# Patient Record
Sex: Female | Born: 1940 | Race: Black or African American | Hispanic: No | Smoking: Never smoker
Health system: Southern US, Community
[De-identification: ages and names within clinical notes are randomized; demographics above are authoritative.]

## PROBLEM LIST (undated history)

## (undated) DIAGNOSIS — K219 Gastro-esophageal reflux disease without esophagitis: Secondary | ICD-10-CM

## (undated) DIAGNOSIS — G4730 Sleep apnea, unspecified: Secondary | ICD-10-CM

## (undated) DIAGNOSIS — M109 Gout, unspecified: Secondary | ICD-10-CM

## (undated) DIAGNOSIS — M1990 Unspecified osteoarthritis, unspecified site: Secondary | ICD-10-CM

## (undated) DIAGNOSIS — I10 Essential (primary) hypertension: Secondary | ICD-10-CM

## (undated) HISTORY — PX: CYST REMOVAL HAND: SHX6279

## (undated) HISTORY — PX: DILATION AND CURETTAGE OF UTERUS: SHX78

## (undated) HISTORY — PX: CORONARY ANGIOPLASTY WITH STENT PLACEMENT: SHX49

## (undated) HISTORY — PX: VEIN LIGATION AND STRIPPING: SHX2653

---

## 2014-11-23 ENCOUNTER — Other Ambulatory Visit: Payer: Self-pay | Admitting: Orthopedic Surgery

## 2014-11-23 DIAGNOSIS — M1711 Unilateral primary osteoarthritis, right knee: Secondary | ICD-10-CM

## 2014-11-29 ENCOUNTER — Ambulatory Visit
Admission: RE | Admit: 2014-11-29 | Discharge: 2014-11-29 | Disposition: A | Discharge status: Home / Self Care | Payer: Medicare HMO | Source: Ambulatory Visit | Attending: Orthopedic Surgery | Admitting: Orthopedic Surgery

## 2014-11-29 DIAGNOSIS — M1711 Unilateral primary osteoarthritis, right knee: Secondary | ICD-10-CM | POA: Diagnosis not present

## 2014-12-28 ENCOUNTER — Encounter
Admission: RE | Admit: 2014-12-28 | Discharge: 2014-12-28 | Disposition: A | Discharge status: Home / Self Care | Payer: Medicare HMO | Source: Ambulatory Visit | Attending: Orthopedic Surgery | Admitting: Orthopedic Surgery

## 2014-12-28 DIAGNOSIS — Z01812 Encounter for preprocedural laboratory examination: Secondary | ICD-10-CM | POA: Insufficient documentation

## 2014-12-28 DIAGNOSIS — R001 Bradycardia, unspecified: Secondary | ICD-10-CM | POA: Diagnosis not present

## 2014-12-28 DIAGNOSIS — I10 Essential (primary) hypertension: Secondary | ICD-10-CM | POA: Diagnosis not present

## 2014-12-28 DIAGNOSIS — Z0183 Encounter for blood typing: Secondary | ICD-10-CM | POA: Insufficient documentation

## 2014-12-28 HISTORY — DX: Gastro-esophageal reflux disease without esophagitis: K21.9

## 2014-12-28 HISTORY — DX: Gout, unspecified: M10.9

## 2014-12-28 HISTORY — DX: Unspecified osteoarthritis, unspecified site: M19.90

## 2014-12-28 HISTORY — DX: Essential (primary) hypertension: I10

## 2014-12-28 LAB — BASIC METABOLIC PANEL
Anion gap: 9 (ref 5–15)
BUN: 23 mg/dL — AB (ref 6–20)
CALCIUM: 10 mg/dL (ref 8.9–10.3)
CO2: 24 mmol/L (ref 22–32)
CREATININE: 1.23 mg/dL — AB (ref 0.44–1.00)
Chloride: 107 mmol/L (ref 101–111)
GFR calc Af Amer: 50 mL/min — ABNORMAL LOW (ref >60)
GFR, EST NON AFRICAN AMERICAN: 43 mL/min — AB (ref >60)
Glucose, Bld: 102 mg/dL — ABNORMAL HIGH (ref 65–99)
Potassium: 4.4 mmol/L (ref 3.5–5.1)
SODIUM: 140 mmol/L (ref 135–145)

## 2014-12-28 LAB — CBC
HEMATOCRIT: 39.7 % (ref 35.0–47.0)
HEMOGLOBIN: 12.7 g/dL (ref 12.0–16.0)
MCH: 28.8 pg (ref 26.0–34.0)
MCHC: 31.9 g/dL — AB (ref 32.0–36.0)
MCV: 90.3 fL (ref 80.0–100.0)
Platelets: 186 10*3/uL (ref 150–440)
RBC: 4.39 MIL/uL (ref 3.80–5.20)
RDW: 16.5 % — ABNORMAL HIGH (ref 11.5–14.5)
WBC: 5.6 10*3/uL (ref 3.6–11.0)

## 2014-12-28 LAB — APTT: APTT: 30 s (ref 24–36)

## 2014-12-28 LAB — URINALYSIS COMPLETE WITH MICROSCOPIC (ARMC ONLY)
Bilirubin Urine: NEGATIVE
Glucose, UA: NEGATIVE mg/dL
Hgb urine dipstick: NEGATIVE
Ketones, ur: NEGATIVE mg/dL
Nitrite: NEGATIVE
PROTEIN: 30 mg/dL — AB
Specific Gravity, Urine: 1.015 (ref 1.005–1.030)
pH: 5 (ref 5.0–8.0)

## 2014-12-28 LAB — SURGICAL PCR SCREEN
MRSA, PCR: NEGATIVE
Staphylococcus aureus: NEGATIVE

## 2014-12-28 LAB — SEDIMENTATION RATE: Sed Rate: 28 mm/hr (ref 0–30)

## 2014-12-28 LAB — TYPE AND SCREEN
ABO/RH(D): O POS
Antibody Screen: NEGATIVE

## 2014-12-28 LAB — ABO/RH: ABO/RH(D): O POS

## 2014-12-28 LAB — PROTIME-INR
INR: 0.96
Prothrombin Time: 13 seconds (ref 11.4–15.0)

## 2014-12-28 NOTE — Patient Instructions (Signed)
  Your procedure is scheduled on: January 12, 2015 (Thursday) Report to Day Surgery. To find out your arrival time please call [phone] between 1PM - 3PM on January 11, 2015 (Wednesday).  Remember: Instructions that are not followed completely may result in serious medical risk, up to and including death, or upon the discretion of your surgeon and anesthesiologist your surgery may need to be rescheduled.    __x__ 1. Do not eat food or drink liquids after midnight. No gum chewing or hard candies.     ____ 2. No Alcohol for 24 hours before or after surgery.   ____ 3. Bring all medications with you on the day of surgery if instructed.    __x__ 4. Notify your doctor if there is any change in your medical condition     (cold, fever, infections).     Do not wear jewelry, make-up, hairpins, clips or nail polish.  Do not wear lotions, powders, or perfumes. You may wear deodorant.  Do not shave 48 hours prior to surgery. Men may shave face and neck.  Do not bring valuables to the hospital.    [facility] is not responsible for any belongings or valuables.               Contacts, dentures or bridgework may not be worn into surgery.  Leave your suitcase in the car. After surgery it may be brought to your room.  For patients admitted to the hospital, discharge time is determined by your                treatment team.   Patients discharged the day of surgery will not be allowed to drive home.   Please read over the following fact sheets that you were given:   MRSA Information and Surgical Site Infection Prevention   ____ Take these medicines the morning of surgery with A SIP OF WATER:    1. Nexium (Nexium at bedtime on August 10)  2.   3.   4.  5.  6.  ____ Fleet Enema (as directed)   _x___ Use CHG Soap as directed  ____ Use inhalers on the day of surgery  ____ Stop metformin 2 days prior to surgery    ____ Take 1/2 of usual insulin dose the night before surgery and none on the  morning of surgery.   ____ Stop Coumadin/Plavix/aspirin on (Ask Dr. Rudene Christians about stopping aspirin and Plavix)  ____ Stop Anti-inflammatories on (Tylenol ok to take for pain)   ____ Stop supplements until after surgery.    ____ Bring C-Pap to the hospital.

## 2014-12-28 NOTE — OR Nursing (Signed)
CLEARANCE BY ANESTHESIA REQUESTED AND FAXED TO [place] AND DR [facility] OFFICE NOTIFIED

## 2014-12-31 LAB — URINE CULTURE

## 2015-01-11 NOTE — OR Nursing (Signed)
CLEARED BY DR Murray

## 2015-01-11 NOTE — OR Nursing (Signed)
REFERRED BY PCP TO [place] [facility]

## 2015-01-12 ENCOUNTER — Encounter
Admission: RE | Disposition: A | Discharge status: Skilled Nursing Facility | Payer: Self-pay | Source: Ambulatory Visit | Attending: Orthopedic Surgery

## 2015-01-12 ENCOUNTER — Inpatient Hospital Stay: Payer: Medicare HMO

## 2015-01-12 ENCOUNTER — Inpatient Hospital Stay: Payer: Medicare HMO | Admitting: Anesthesiology

## 2015-01-12 ENCOUNTER — Encounter: Payer: Self-pay | Admitting: *Deleted

## 2015-01-12 ENCOUNTER — Inpatient Hospital Stay
Admission: RE | Admit: 2015-01-12 | Discharge: 2015-01-15 | DRG: 470 | Disposition: A | Discharge status: Skilled Nursing Facility | Payer: Medicare HMO | Source: Ambulatory Visit | Attending: Orthopedic Surgery | Admitting: Orthopedic Surgery

## 2015-01-12 DIAGNOSIS — Z7982 Long term (current) use of aspirin: Secondary | ICD-10-CM

## 2015-01-12 DIAGNOSIS — Z86718 Personal history of other venous thrombosis and embolism: Secondary | ICD-10-CM | POA: Diagnosis not present

## 2015-01-12 DIAGNOSIS — G4700 Insomnia, unspecified: Secondary | ICD-10-CM | POA: Diagnosis present

## 2015-01-12 DIAGNOSIS — M1710 Unilateral primary osteoarthritis, unspecified knee: Secondary | ICD-10-CM | POA: Diagnosis present

## 2015-01-12 DIAGNOSIS — Z6841 Body Mass Index (BMI) 40.0 and over, adult: Secondary | ICD-10-CM

## 2015-01-12 DIAGNOSIS — E785 Hyperlipidemia, unspecified: Secondary | ICD-10-CM | POA: Diagnosis present

## 2015-01-12 DIAGNOSIS — I10 Essential (primary) hypertension: Secondary | ICD-10-CM | POA: Diagnosis present

## 2015-01-12 DIAGNOSIS — Z888 Allergy status to other drugs, medicaments and biological substances status: Secondary | ICD-10-CM

## 2015-01-12 DIAGNOSIS — Z79899 Other long term (current) drug therapy: Secondary | ICD-10-CM | POA: Diagnosis not present

## 2015-01-12 DIAGNOSIS — M1711 Unilateral primary osteoarthritis, right knee: Principal | ICD-10-CM | POA: Diagnosis present

## 2015-01-12 DIAGNOSIS — K219 Gastro-esophageal reflux disease without esophagitis: Secondary | ICD-10-CM | POA: Diagnosis present

## 2015-01-12 DIAGNOSIS — E669 Obesity, unspecified: Secondary | ICD-10-CM | POA: Diagnosis present

## 2015-01-12 DIAGNOSIS — M109 Gout, unspecified: Secondary | ICD-10-CM | POA: Diagnosis present

## 2015-01-12 DIAGNOSIS — Z951 Presence of aortocoronary bypass graft: Secondary | ICD-10-CM | POA: Diagnosis not present

## 2015-01-12 DIAGNOSIS — I25810 Atherosclerosis of coronary artery bypass graft(s) without angina pectoris: Secondary | ICD-10-CM | POA: Diagnosis present

## 2015-01-12 DIAGNOSIS — G8918 Other acute postprocedural pain: Secondary | ICD-10-CM

## 2015-01-12 HISTORY — PX: TOTAL KNEE ARTHROPLASTY: SHX125

## 2015-01-12 LAB — TYPE AND SCREEN
ABO/RH(D): O POS
Antibody Screen: NEGATIVE

## 2015-01-12 SURGERY — ARTHROPLASTY, KNEE, TOTAL
Anesthesia: Spinal | Site: Knee | Laterality: Right | Wound class: Clean

## 2015-01-12 MED ORDER — MAGNESIUM CITRATE PO SOLN
1.0000 | Freq: Once | ORAL | Status: DC | PRN
  Filled 2015-01-12: qty 296

## 2015-01-12 MED ORDER — TRANEXAMIC ACID 1000 MG/10ML IV SOLN
1000.0000 mg | INTRAVENOUS | Status: DC | PRN
  Administered 2015-01-12: 1000 mg via INTRAVENOUS

## 2015-01-12 MED ORDER — BUPIVACAINE-EPINEPHRINE (PF) 0.25% -1:200000 IJ SOLN
INTRAMUSCULAR | Status: DC | PRN
  Administered 2015-01-12: 30 mL via PERINEURAL

## 2015-01-12 MED ORDER — QUINAPRIL HCL 10 MG PO TABS
40.0000 mg | ORAL_TABLET | Freq: Every day | ORAL | Status: DC
  Administered 2015-01-13 – 2015-01-15 (×3): 40 mg via ORAL
  Filled 2015-01-12 (×4): qty 4

## 2015-01-12 MED ORDER — SODIUM CHLORIDE 0.9 % IV SOLN
INTRAVENOUS | Status: DC
  Administered 2015-01-12 – 2015-01-13 (×2): via INTRAVENOUS

## 2015-01-12 MED ORDER — METOPROLOL TARTRATE 50 MG PO TABS
50.0000 mg | ORAL_TABLET | Freq: Two times a day (BID) | ORAL | Status: DC
  Administered 2015-01-12 – 2015-01-15 (×6): 50 mg via ORAL
  Filled 2015-01-12 (×7): qty 1

## 2015-01-12 MED ORDER — GLYCOPYRROLATE 0.2 MG/ML IJ SOLN
INTRAMUSCULAR | Status: DC | PRN
  Administered 2015-01-12: 0.2 mg via INTRAVENOUS

## 2015-01-12 MED ORDER — MORPHINE SULFATE 2 MG/ML IJ SOLN
2.0000 mg | INTRAMUSCULAR | Status: DC | PRN
  Administered 2015-01-12 – 2015-01-13 (×6): 2 mg via INTRAVENOUS
  Filled 2015-01-12 (×5): qty 1

## 2015-01-12 MED ORDER — METOCLOPRAMIDE HCL 5 MG/ML IJ SOLN: 5.0000 mg | Freq: Three times a day (TID) | INTRAMUSCULAR | Status: DC | PRN

## 2015-01-12 MED ORDER — SODIUM CHLORIDE 0.9 % IJ SOLN
INTRAMUSCULAR | Status: AC
  Filled 2015-01-12: qty 50

## 2015-01-12 MED ORDER — CEFAZOLIN SODIUM-DEXTROSE 2-3 GM-% IV SOLR
2.0000 g | Freq: Four times a day (QID) | INTRAVENOUS | Status: AC
  Administered 2015-01-12 (×2): 2 g via INTRAVENOUS
  Filled 2015-01-12 (×2): qty 50

## 2015-01-12 MED ORDER — LACTATED RINGERS IV SOLN
INTRAVENOUS | Status: DC
  Administered 2015-01-12 (×2): via INTRAVENOUS

## 2015-01-12 MED ORDER — ASPIRIN EC 325 MG PO TBEC
325.0000 mg | DELAYED_RELEASE_TABLET | Freq: Every day | ORAL | Status: DC
  Administered 2015-01-13 – 2015-01-15 (×3): 325 mg via ORAL
  Filled 2015-01-12 (×3): qty 1

## 2015-01-12 MED ORDER — LIDOCAINE HCL (PF) 2 % IJ SOLN
INTRAMUSCULAR | Status: DC | PRN
  Administered 2015-01-12: 50 mg

## 2015-01-12 MED ORDER — ZOLPIDEM TARTRATE 5 MG PO TABS: 5.0000 mg | ORAL_TABLET | Freq: Every evening | ORAL | Status: DC | PRN

## 2015-01-12 MED ORDER — BUPIVACAINE LIPOSOME 1.3 % IJ SUSP
INTRAMUSCULAR | Status: AC
  Filled 2015-01-12: qty 20

## 2015-01-12 MED ORDER — MORPHINE (PF) INJECTION FOR INHALATION 10 MG/ML
RESPIRATORY_TRACT | Status: DC | PRN
  Administered 2015-01-12: 10 mg via RESPIRATORY_TRACT

## 2015-01-12 MED ORDER — ASPIRIN EC 81 MG PO TBEC: 81.0000 mg | DELAYED_RELEASE_TABLET | Freq: Every day | ORAL | Status: DC

## 2015-01-12 MED ORDER — ALLOPURINOL 300 MG PO TABS
300.0000 mg | ORAL_TABLET | Freq: Every day | ORAL | Status: DC
  Administered 2015-01-12 – 2015-01-15 (×4): 300 mg via ORAL
  Filled 2015-01-12 (×5): qty 1

## 2015-01-12 MED ORDER — ACETAMINOPHEN 650 MG RE SUPP: 650.0000 mg | Freq: Four times a day (QID) | RECTAL | Status: DC | PRN

## 2015-01-12 MED ORDER — ISOSORBIDE DINITRATE 10 MG PO TABS
20.0000 mg | ORAL_TABLET | Freq: Every evening | ORAL | Status: DC
  Administered 2015-01-12 – 2015-01-14 (×3): 20 mg via ORAL
  Filled 2015-01-12 (×3): qty 2

## 2015-01-12 MED ORDER — ACETAMINOPHEN 325 MG PO TABS: 650.0000 mg | ORAL_TABLET | Freq: Four times a day (QID) | ORAL | Status: DC | PRN

## 2015-01-12 MED ORDER — BISACODYL 10 MG RE SUPP
10.0000 mg | Freq: Every day | RECTAL | Status: DC | PRN
Start: 2015-01-12 — End: 2015-01-15
  Administered 2015-01-14: 10 mg via RECTAL
  Filled 2015-01-12: qty 1

## 2015-01-12 MED ORDER — FENTANYL CITRATE (PF) 100 MCG/2ML IJ SOLN
INTRAMUSCULAR | Status: DC | PRN
  Administered 2015-01-12: 25 ug via INTRAVENOUS
  Administered 2015-01-12: 50 ug via INTRAVENOUS
  Administered 2015-01-12: 25 ug via INTRAVENOUS

## 2015-01-12 MED ORDER — PANTOPRAZOLE SODIUM 40 MG PO TBEC
40.0000 mg | DELAYED_RELEASE_TABLET | Freq: Every day | ORAL | Status: DC
  Administered 2015-01-12 – 2015-01-15 (×4): 40 mg via ORAL
  Filled 2015-01-12 (×5): qty 1

## 2015-01-12 MED ORDER — NEOMYCIN-POLYMYXIN B GU 40-200000 IR SOLN
Status: DC | PRN
  Administered 2015-01-12: 16 mL

## 2015-01-12 MED ORDER — TRAZODONE HCL 100 MG PO TABS
100.0000 mg | ORAL_TABLET | Freq: Every day | ORAL | Status: DC
  Administered 2015-01-12 – 2015-01-14 (×3): 100 mg via ORAL
  Filled 2015-01-12 (×3): qty 1

## 2015-01-12 MED ORDER — MENTHOL 3 MG MT LOZG
1.0000 | LOZENGE | OROMUCOSAL | Status: DC | PRN
  Filled 2015-01-12: qty 9

## 2015-01-12 MED ORDER — CEFAZOLIN SODIUM-DEXTROSE 2-3 GM-% IV SOLR
2.0000 g | Freq: Once | INTRAVENOUS | Status: AC
  Administered 2015-01-12: 2 g via INTRAVENOUS

## 2015-01-12 MED ORDER — ONDANSETRON HCL 4 MG/2ML IJ SOLN: 4.0000 mg | Freq: Four times a day (QID) | INTRAMUSCULAR | Status: DC | PRN

## 2015-01-12 MED ORDER — FUROSEMIDE 20 MG PO TABS
20.0000 mg | ORAL_TABLET | Freq: Every day | ORAL | Status: DC
  Administered 2015-01-13 – 2015-01-15 (×3): 20 mg via ORAL
  Filled 2015-01-12 (×5): qty 1

## 2015-01-12 MED ORDER — MAGNESIUM HYDROXIDE 400 MG/5ML PO SUSP
30.0000 mL | Freq: Every day | ORAL | Status: DC | PRN
  Administered 2015-01-14: 30 mL via ORAL
  Filled 2015-01-12: qty 30

## 2015-01-12 MED ORDER — NITROGLYCERIN 0.4 MG SL SUBL: 0.4000 mg | SUBLINGUAL_TABLET | SUBLINGUAL | Status: DC | PRN

## 2015-01-12 MED ORDER — ONDANSETRON HCL 4 MG PO TABS: 4.0000 mg | ORAL_TABLET | Freq: Four times a day (QID) | ORAL | Status: DC | PRN

## 2015-01-12 MED ORDER — BUPIVACAINE HCL (PF) 0.5 % IJ SOLN
INTRAMUSCULAR | Status: AC
  Filled 2015-01-12: qty 30

## 2015-01-12 MED ORDER — CEFAZOLIN SODIUM-DEXTROSE 2-3 GM-% IV SOLR
INTRAVENOUS | Status: AC
  Filled 2015-01-12: qty 50

## 2015-01-12 MED ORDER — ONDANSETRON HCL 4 MG/2ML IJ SOLN: 4.0000 mg | Freq: Once | INTRAMUSCULAR | Status: DC | PRN

## 2015-01-12 MED ORDER — ALUM & MAG HYDROXIDE-SIMETH 200-200-20 MG/5ML PO SUSP
30.0000 mL | ORAL | Status: DC | PRN
  Administered 2015-01-12: 30 mL via ORAL
  Filled 2015-01-12: qty 30

## 2015-01-12 MED ORDER — PROPOFOL INFUSION 10 MG/ML OPTIME
INTRAVENOUS | Status: DC | PRN
  Administered 2015-01-12: 25 ug/kg/min via INTRAVENOUS

## 2015-01-12 MED ORDER — BUPIVACAINE LIPOSOME 1.3 % IJ SUSP
INTRAMUSCULAR | Status: DC | PRN
Start: 2015-01-12 — End: 2015-01-12
  Administered 2015-01-12: 60 mL

## 2015-01-12 MED ORDER — SODIUM CHLORIDE 0.9 % IJ SOLN: INTRAMUSCULAR | Status: DC | PRN

## 2015-01-12 MED ORDER — WHITE PETROLATUM GEL
Status: AC
  Filled 2015-01-12: qty 5

## 2015-01-12 MED ORDER — BUPIVACAINE-EPINEPHRINE (PF) 0.25% -1:200000 IJ SOLN
INTRAMUSCULAR | Status: AC
  Filled 2015-01-12: qty 30

## 2015-01-12 MED ORDER — FENTANYL CITRATE (PF) 100 MCG/2ML IJ SOLN: 25.0000 ug | INTRAMUSCULAR | Status: DC | PRN

## 2015-01-12 MED ORDER — CLOPIDOGREL BISULFATE 75 MG PO TABS
75.0000 mg | ORAL_TABLET | Freq: Every day | ORAL | Status: DC
  Administered 2015-01-12 – 2015-01-15 (×4): 75 mg via ORAL
  Filled 2015-01-12 (×5): qty 1

## 2015-01-12 MED ORDER — DOCUSATE SODIUM 100 MG PO CAPS
100.0000 mg | ORAL_CAPSULE | Freq: Two times a day (BID) | ORAL | Status: DC
  Administered 2015-01-12 – 2015-01-15 (×7): 100 mg via ORAL
  Filled 2015-01-12 (×8): qty 1

## 2015-01-12 MED ORDER — NEOMYCIN-POLYMYXIN B GU 40-200000 IR SOLN
Status: AC
  Filled 2015-01-12: qty 20

## 2015-01-12 MED ORDER — ATORVASTATIN CALCIUM 20 MG PO TABS
40.0000 mg | ORAL_TABLET | Freq: Every day | ORAL | Status: DC
  Administered 2015-01-12 – 2015-01-14 (×3): 40 mg via ORAL
  Filled 2015-01-12 (×3): qty 2

## 2015-01-12 MED ORDER — FELODIPINE ER 5 MG PO TB24
10.0000 mg | ORAL_TABLET | Freq: Every day | ORAL | Status: DC
  Administered 2015-01-13 – 2015-01-15 (×3): 10 mg via ORAL
  Filled 2015-01-12: qty 1
  Filled 2015-01-12 (×4): qty 2

## 2015-01-12 MED ORDER — PHENOL 1.4 % MT LIQD
1.0000 | OROMUCOSAL | Status: DC | PRN
Start: 2015-01-12 — End: 2015-01-15
  Filled 2015-01-12: qty 177

## 2015-01-12 MED ORDER — METOCLOPRAMIDE HCL 5 MG PO TABS: 5.0000 mg | ORAL_TABLET | Freq: Three times a day (TID) | ORAL | Status: DC | PRN

## 2015-01-12 MED ORDER — MORPHINE SULFATE 10 MG/ML IJ SOLN
INTRAMUSCULAR | Status: AC
  Filled 2015-01-12: qty 1

## 2015-01-12 MED ORDER — BUPIVACAINE HCL (PF) 0.5 % IJ SOLN
INTRAMUSCULAR | Status: DC | PRN
  Administered 2015-01-12: 3 mL via INTRATHECAL

## 2015-01-12 MED ORDER — OXYCODONE HCL 5 MG PO TABS
5.0000 mg | ORAL_TABLET | ORAL | Status: DC | PRN
  Administered 2015-01-12 (×2): 10 mg via ORAL
  Administered 2015-01-12: 5 mg via ORAL
  Administered 2015-01-13 – 2015-01-14 (×6): 10 mg via ORAL
  Administered 2015-01-15: 5 mg via ORAL
  Administered 2015-01-15: 10 mg via ORAL
  Filled 2015-01-12 (×4): qty 2
  Filled 2015-01-12: qty 1
  Filled 2015-01-12 (×3): qty 2
  Filled 2015-01-12: qty 1
  Filled 2015-01-12 (×2): qty 2

## 2015-01-12 MED ORDER — MIDAZOLAM HCL 5 MG/5ML IJ SOLN
INTRAMUSCULAR | Status: DC | PRN
  Administered 2015-01-12 (×2): 1 mg via INTRAVENOUS

## 2015-01-12 SURGICAL SUPPLY — 53 items
BANDAGE ELASTIC 6 CLIP ST LF (GAUZE/BANDAGES/DRESSINGS) ×2 IMPLANT
BLADE BOVIE TIP EXT 4 (BLADE) ×2 IMPLANT
BLADE SAW 1 (BLADE) ×2 IMPLANT
BLOCK CUTTING TIBIAL 2 RT (MISCELLANEOUS) IMPLANT
BLOCK FEMUR CUTTING 2P RIGHT (MISCELLANEOUS) IMPLANT
CANISTER SUCT 1200ML W/VALVE (MISCELLANEOUS) ×2 IMPLANT
CANISTER SUCT 3000ML (MISCELLANEOUS) ×4 IMPLANT
CAPT KNEE TOTAL 3 ×2 IMPLANT
CATH FOL LEG HOLDER (MISCELLANEOUS) ×2 IMPLANT
CATH TRAY METER 16FR LF (MISCELLANEOUS) ×2 IMPLANT
CEMENT HV SMART SET (Cement) ×4 IMPLANT
CHLORAPREP W/TINT 26ML (MISCELLANEOUS) ×4 IMPLANT
COOLER POLAR GLACIER W/PUMP (MISCELLANEOUS) ×2 IMPLANT
DRAPE INCISE IOBAN 66X45 STRL (DRAPES) ×2 IMPLANT
DRAPE SHEET LG 3/4 BI-LAMINATE (DRAPES) ×4 IMPLANT
ELECT CAUTERY BLADE 6.4 (BLADE) ×2 IMPLANT
GAUZE PETRO XEROFOAM 1X8 (MISCELLANEOUS) ×2 IMPLANT
GAUZE SPONGE 4X4 12PLY STRL (GAUZE/BANDAGES/DRESSINGS) ×2 IMPLANT
GLOVE BIOGEL PI IND STRL 9 (GLOVE) ×1 IMPLANT
GLOVE BIOGEL PI INDICATOR 9 (GLOVE) ×1
GLOVE SURG ORTHO 9.0 STRL STRW (GLOVE) ×2 IMPLANT
GOWN SPECIALTY ULTRA XL (MISCELLANEOUS) ×2 IMPLANT
GOWN STRL REUS W/ TWL LRG LVL3 (GOWN DISPOSABLE) ×2 IMPLANT
GOWN STRL REUS W/TWL LRG LVL3 (GOWN DISPOSABLE) ×2
HANDPIECE SUCTION TUBG SURGILV (MISCELLANEOUS) ×2 IMPLANT
HOOD PEEL AWAY FACE SHEILD DIS (HOOD) ×4 IMPLANT
IMMBOLIZER KNEE 19 BLUE UNIV (SOFTGOODS) ×2 IMPLANT
IV SET EXTENSION 6 LL TADAPT (SET/KITS/TRAYS/PACK) IMPLANT
KNEE MEDACTA TIBIAL/FEMORAL BL (Knees) ×2 IMPLANT
KNIFE SCULPS 14X20 (INSTRUMENTS) ×2 IMPLANT
NDL SAFETY 18GX1.5 (NEEDLE) ×2 IMPLANT
NEEDLE SPNL 18GX3.5 QUINCKE PK (NEEDLE) ×2 IMPLANT
NEEDLE SPNL 20GX3.5 QUINCKE YW (NEEDLE) ×2 IMPLANT
NS IRRIG 1000ML POUR BTL (IV SOLUTION) ×2 IMPLANT
PACK TOTAL KNEE (MISCELLANEOUS) ×2 IMPLANT
PAD GROUND ADULT SPLIT (MISCELLANEOUS) ×2 IMPLANT
PAD WRAPON POLOR MULTI XL (MISCELLANEOUS) ×1 IMPLANT
SOL .9 NS 3000ML IRR  AL (IV SOLUTION) ×1
SOL .9 NS 3000ML IRR UROMATIC (IV SOLUTION) ×1 IMPLANT
STAPLER SKIN PROX 35W (STAPLE) ×2 IMPLANT
STEM EXTENSION 11MMX30MM (Stem) ×2 IMPLANT
STRAP SAFETY BODY (MISCELLANEOUS) ×2 IMPLANT
SUCTION FRAZIER TIP 10 FR DISP (SUCTIONS) ×2 IMPLANT
SUT DVC 2 QUILL PDO  T11 36X36 (SUTURE) ×1
SUT DVC 2 QUILL PDO T11 36X36 (SUTURE) ×1 IMPLANT
SUT DVC QUILL MONODERM 30X30 (SUTURE) ×2 IMPLANT
SUT ETHIBOND NAB CT1 #1 30IN (SUTURE) ×2 IMPLANT
SYR 20CC LL (SYRINGE) ×2 IMPLANT
SYR 50ML LL SCALE MARK (SYRINGE) ×2 IMPLANT
TOWER CARTRIDGE SMART MIX (DISPOSABLE) ×2 IMPLANT
WATER STERILE IRR 1000ML POUR (IV SOLUTION) ×2 IMPLANT
WRAP-ON POLOR PAD MULTI XL (MISCELLANEOUS) ×1
WRAPON POLOR PAD MULTI XL (MISCELLANEOUS) ×1

## 2015-01-12 NOTE — Anesthesia Preprocedure Evaluation (Signed)
Anesthesia Evaluation  Patient identified by MRN, date of birth, ID band Patient awake    Reviewed: Allergy & Precautions, NPO status , Patient's Chart, lab work & pertinent test results  Airway Mallampati: I  TM Distance: >3 FB Neck ROM: Full    Dental  (+) Upper Dentures, Lower Dentures   Pulmonary          Cardiovascular hypertension, Pt. on medications and Pt. on home beta blockers + CAD and + Cardiac Stents     Neuro/Psych    GI/Hepatic GERD-  Medicated and Controlled,  Endo/Other    Renal/GU      Musculoskeletal   Abdominal   Peds  Hematology   Anesthesia Other Findings   Reproductive/Obstetrics                             Anesthesia Physical Anesthesia Plan  ASA: III  Anesthesia Plan: Spinal   Post-op Pain Management:    Induction: Intravenous  Airway Management Planned:   Additional Equipment:   Intra-op Plan:   Post-operative Plan:   Informed Consent: I have reviewed the patients History and Physical, chart, labs and discussed the procedure including the risks, benefits and alternatives for the proposed anesthesia with the patient or authorized representative who has indicated his/her understanding and acceptance.     Plan Discussed with:   Anesthesia Plan Comments:         Anesthesia Quick Evaluation

## 2015-01-12 NOTE — Op Note (Signed)
01/12/2015  9:57 AM  PATIENT:  Cindy Roberts  74 y.o. female  PRE-OPERATIVE DIAGNOSIS:  PRIMARY OSTEOARTHRITIS OF RIGHT KNEE  POST-OPERATIVE DIAGNOSIS:  PRIMARY OSTEOARTHRITIS OF RIGHT KNEE  PROCEDURE:  Procedure(s): TOTAL KNEE ARTHROPLASTY (Right)  SURGEON: Laurene Footman, MD  ASSISTANTS: None  ANESTHESIA:   spinal  EBL:  Total I/O In: 1700 [I.V.:1700] Out: 150 [Urine:100; Blood:50]  BLOOD ADMINISTERED:none  DRAINS: none   LOCAL MEDICATIONS USED:  MARCAINE    and OTHER Exparel and morphine  SPECIMEN:  Source of Specimen:  Cut ends of bone  DISPOSITION OF SPECIMEN:  PATHOLOGY  COUNTS:  YES  TOURNIQUET:   110 minutes at 300 mmHg  IMPLANTS: GMK sphere 2+ femur, 2 tibia with 12 mm insert and to patella all components cemented from [place]: .Dragon Dictation patient brought the operating room and after adequate spinal anesthesia was obtained the right leg was prepped and draped in the sterile fashion was turned by the upper thigh. After patient education and timeout procedures were completed tourniquet was raised to 3 mmHg. Midline skin incision followed by medial parapatellar arthrotomy and actually showed remarkable degenerative changes to the medial compartment with expected extensive bone loss and spurring as well as extensive spurring of the patella complete loss of cartilage in the joint the anterior cruciate ligament and PCL were excised along the fat pad the Medacta cutting guide was applied to the tibia in part proximal tibia cut carried out with the distal femur resected in a similar fashion. The menisci were excised after the 4-in-1 cutting guide was placed and anterior posterior and chamfer cuts made. The 2 tibia was placed trial and proximal preparation carried out with drilling and hand reaming for a short stem. The 2+ femur was applied and a 12 mm insert gave appropriate stability with very good range of motion. Extension and flexion past 110*the case range  of motion was approximately 20 to 90. The patella was cut using the patellar cutting guide and after drilling 3 drill holes were made and sized to a size 2. The bony surfaces were thoroughly irrigated and dried periarticular injection of Exparel as well as a combination of morphine and Sensorcaine with epinephrine were infiltrated as well. With the bony surfaces dried the components were cemented cemented into place after the cement had set excess cement was removed and the knee thoroughly irrigated with a dilute Betadine solution in pulsatile lavage. The arthrotomy was closed using a heavy Quill with TXA injected into the joint following capsule closure. 2-0Quill subcutaneously and then skin staples. Xeroform 4 x 4's ABDs and web roll Polar Care and Ace wrap applied  PLAN OF CARE: Admit to inpatient   PATIENT DISPOSITION:  PACU - hemodynamically stable.

## 2015-01-12 NOTE — Care Management Note (Signed)
Case Management Note  Patient Details  Name: Cindy Roberts MRN: [id number] Date of Birth: 06/11/1940  Subjective/Objective:                  Patient received from PACU today. Met with patient and family to discuss discharge planning. She states her son lives close by and her brother [name] stays with her. Son will provide transportation. PT pending. Patient is on Plavix and is unsure if Lovenox will be required. She uses [facility] for Rx. She has a bedside commode but no front-wheeled walker.   Action/Plan: RNCM will continue to follow. List of home health providers left with patient.   Expected Discharge Date:                  Expected Discharge Plan:     In-House Referral:     Discharge planning Services  CM Consult  Post Acute Care Choice:  Home Health, Durable Medical Equipment Choice offered to:  Patient, Adult Children  DME Arranged:    DME Agency:     HH Arranged:    [place] Agency:     Status of Service:  In process, will continue to follow  Medicare Important Message Given:    Date Medicare IM Given:    Medicare IM give by:    Date Additional Medicare IM Given:    Additional Medicare Important Message give by:     If discussed at [place] of Stay Meetings, dates discussed:    Additional Comments:  Marshell Garfinkel, RN 01/12/2015, 2:35 PM

## 2015-01-12 NOTE — Progress Notes (Signed)
Error made in charting a hemovac. Do drain present when coming from PACU.

## 2015-01-12 NOTE — Transfer of Care (Signed)
Immediate Anesthesia Transfer of Care Note  Patient: Cindy Roberts  Procedure(s) Performed: Procedure(s): TOTAL KNEE ARTHROPLASTY (Right)  Patient Location: PACU  Anesthesia Type:Spinal  Level of Consciousness: awake  Airway & Oxygen Therapy: Patient Spontanous Breathing and Patient connected to face mask oxygen  Post-op Assessment: Report given to RN  Post vital signs: Reviewed  Last Vitals:  Filed Vitals:   01/12/15 0958  BP: 99/50  Pulse: 73  Temp: 36.9 C  Resp: 21    Complications: No apparent anesthesia complications

## 2015-01-12 NOTE — Anesthesia Procedure Notes (Signed)
Spinal Patient location during procedure: OR Start time: 01/12/2015 7:20 AM End time: 01/12/2015 7:26 AM Staffing Anesthesiologist: Gunnar Fusi Resident/CRNA: Rolla Plate Performed by: resident/CRNA  Preanesthetic Checklist Completed: patient identified, site marked, surgical consent, pre-op evaluation, timeout performed, IV checked, risks and benefits discussed and monitors and equipment checked Spinal Block Patient position: sitting Prep: Betadine and site prepped and draped Patient monitoring: heart rate, continuous pulse ox, blood pressure and cardiac monitor Approach: midline Location: L4-5 Injection technique: single-shot Needle Needle type: Whitacre and Introducer  Needle gauge: 24 G Needle length: 9 cm Additional Notes Negative paresthesia. Negative blood return. Positive free-flowing CSF. Expiration date of kit checked and confirmed. Patient tolerated procedure well, without complications.

## 2015-01-12 NOTE — Progress Notes (Signed)
Pt dangled at bedside

## 2015-01-12 NOTE — H&P (Signed)
Reviewed paper H+P, will be scanned into chart. No changes noted.  

## 2015-01-13 LAB — CBC
HEMATOCRIT: 35.8 % (ref 35.0–47.0)
HEMOGLOBIN: 11.5 g/dL — AB (ref 12.0–16.0)
MCH: 29.3 pg (ref 26.0–34.0)
MCHC: 32.1 g/dL (ref 32.0–36.0)
MCV: 91.5 fL (ref 80.0–100.0)
Platelets: 155 10*3/uL (ref 150–440)
RBC: 3.91 MIL/uL (ref 3.80–5.20)
RDW: 16.9 % — ABNORMAL HIGH (ref 11.5–14.5)
WBC: 11.5 10*3/uL — ABNORMAL HIGH (ref 3.6–11.0)

## 2015-01-13 LAB — BASIC METABOLIC PANEL
Anion gap: 9 (ref 5–15)
BUN: 23 mg/dL — ABNORMAL HIGH (ref 6–20)
CO2: 24 mmol/L (ref 22–32)
CREATININE: 1.16 mg/dL — AB (ref 0.44–1.00)
Calcium: 8.8 mg/dL — ABNORMAL LOW (ref 8.9–10.3)
Chloride: 104 mmol/L (ref 101–111)
GFR calc Af Amer: 54 mL/min — ABNORMAL LOW (ref >60)
GFR calc non Af Amer: 46 mL/min — ABNORMAL LOW (ref >60)
Glucose, Bld: 133 mg/dL — ABNORMAL HIGH (ref 65–99)
Potassium: 4.7 mmol/L (ref 3.5–5.1)
Sodium: 137 mmol/L (ref 135–145)

## 2015-01-13 NOTE — Progress Notes (Signed)
  Subjective: 1 Day Post-Op Procedure(s) (LRB): TOTAL KNEE ARTHROPLASTY (Right) Patient reports pain as moderate.   Patient seen in rounds with Dr. Marry Guan. Patient is well, and has had no acute complaints or problems Plan is to go Rehab after hospital stay, but wants to talk to the social worker about her home situation. She would like to go home, but does not have a lot of transportation.  Negative for chest pain and shortness of breath Fever: no Gastrointestinal: Negative for nausea and vomiting  Objective: Vital signs in last 24 hours: Temp:  [95.9 F (35.5 C)-99.5 F (37.5 C)] 99.1 F (37.3 C) (08/12 0345) Pulse Rate:  [45-87] 79 (08/12 0347) Resp:  [15-20] 18 (08/12 0345) BP: (99-182)/(47-74) 182/70 mmHg (08/12 0347) SpO2:  [92 %-100 %] 95 % (08/12 0347) Weight:  [112.9 kg (248 lb 14.4 oz)] 112.9 kg (248 lb 14.4 oz) (08/11 1135)  Intake/Output from previous day:  Intake/Output Summary (Last 24 hours) at 01/13/15 0538 Last data filed at 01/13/15 0352  Gross per 24 hour  Intake   2695 ml  Output   2200 ml  Net    495 ml    Intake/Output this shift: Total I/O In: -  Out: 1150 [Urine:1150]  Labs:  Recent Labs  01/13/15 0341  HGB 11.5*    Recent Labs  01/13/15 0341  WBC 11.5*  RBC 3.91  HCT 35.8  PLT 155    Recent Labs  01/13/15 0341  NA 137  K 4.7  CL 104  CO2 24  BUN 23*  CREATININE 1.16*  GLUCOSE 133*  CALCIUM 8.8*   No results for input(s): LABPT, INR in the last 72 hours.   EXAM General - Patient is Alert and Oriented Extremity - Neurovascular intact Sensation intact distally Dorsiflexion/Plantar flexion intact Dressing/Incision - clean, dry Motor Function - intact, moving foot and toes well on exam.   Past Medical History  Diagnosis Date  . Hypertension   . GERD (gastroesophageal reflux disease)   . Arthritis   . Gout     Assessment/Plan: 1 Day Post-Op Procedure(s) (LRB): TOTAL KNEE ARTHROPLASTY (Right) Active Problems:  Primary osteoarthritis of knee  Estimated body mass index is 45.51 kg/(m^2) as calculated from the following:   Height as of this encounter: 5\' 2"  (1.575 m).   Weight as of this encounter: 112.9 kg (248 lb 14.4 oz). Advance diet Up with therapy  DVT Prophylaxis - Aspirin, Foot Pumps, TED hose and Plavix Weight-Bearing as tolerated to right leg  Reche Dixon, PA-C Orthopaedic Surgery 01/13/2015, 5:38 AM

## 2015-01-13 NOTE — Evaluation (Signed)
Physical Therapy Evaluation Patient Details Name: Cindy Roberts MRN: [id number] DOB: 1941-02-21 Today's Date: 01/13/2015   History of Present Illness  s/p R total knee replacement  Clinical Impression  Pt shows good effort and willingness to try despite her pain, but ultimately is quite limited with standing/walking and is pain limited with the 10 minutes of exercises apart from the PT exam.  Apparently her knee ROM pre-op was quite limited from 20-90 only.    Follow Up Recommendations SNF    Equipment Recommendations       Recommendations for Other Services       Precautions / Restrictions Precautions Required Braces or Orthoses: Knee Immobilizer - Right Restrictions Weight Bearing Restrictions: Yes RLE Weight Bearing: Weight bearing as tolerated      Mobility  Bed Mobility Overal bed mobility: Needs Assistance Bed Mobility: Supine to Sit     Supine to sit: Min assist     General bed mobility comments: pt heavily reliant on the rails and needs extra time, but shows very good effort.   Transfers Overall transfer level: Needs assistance Equipment used: Rolling walker (2 wheeled) Transfers: Sit to/from Stand Sit to Stand: Min assist (much cuing for encouragement and set up/sequencing)            Ambulation/Gait Ambulation/Gait assistance: Mod assist Ambulation Distance (Feet): 6 Feet Assistive device: Rolling walker (2 wheeled)       General Gait Details: Pt with very slow, hesitant and unsure gait.  She is has no LOBs, but is very guarded, pain limited and heavily reliant on the walker.   Stairs            Wheelchair Mobility    Modified Rankin (Stroke Patients Only)       Balance                                             Pertinent Vitals/Pain Pain Assessment: 0-10 Pain Score: 9     Home Living Family/patient expects to be discharged to:: Skilled nursing facility Living Arrangements: Other relatives                     Prior Function Level of Independence: Independent         Comments: Pt was able to get out of the house and run errands, etc     Hand Dominance        Extremity/Trunk Assessment   Upper Extremity Assessment: Overall WFL for tasks assessed           Lower Extremity Assessment: RLE deficits/detail RLE Deficits / Details: Pt unable to perform R LE SLR, but is able to show some AROM (grossly 3+/5) with most other R LE acts       Communication   Communication: No difficulties  Cognition Arousal/Alertness: Awake/alert Behavior During Therapy: WFL for tasks assessed/performed Overall Cognitive Status: Within Functional Limits for tasks assessed                      General Comments      Exercises Total Joint Exercises Ankle Circles/Pumps: AROM;10 reps Quad Sets: AROM;Strengthening;10 reps Heel Slides: AROM;10 reps Hip ABduction/ADduction: AROM;10 reps Straight Leg Raises: PROM;5 reps Knee Flexion: PROM;5 reps Goniometric ROM: 2-76      Assessment/Plan    PT Assessment Patient needs continued PT services  PT Diagnosis Generalized weakness;Difficulty  walking;Acute pain   PT Problem List Decreased strength;Decreased range of motion;Decreased activity tolerance;Decreased balance;Decreased mobility;Decreased safety awareness;Pain  PT Treatment Interventions Gait training;Therapeutic activities;Balance training;Therapeutic exercise;Functional mobility training;Stair training;Neuromuscular re-education   PT Goals (Current goals can be found in the Care Plan section) Acute Rehab PT Goals Patient Stated Goal: "I'd really like to go home, but I'm seeing that would be tough." PT Goal Formulation: With patient Time For Goal Achievement: 01/27/15    Frequency BID   Barriers to discharge        Co-evaluation               End of Session Equipment Utilized During Treatment: Gait belt Activity Tolerance: Patient limited by pain;Patient  limited by fatigue Patient left: with chair alarm set           Time: [phone] PT Time Calculation (min) (ACUTE ONLY): 32 min   Charges:   PT Evaluation $Initial PT Evaluation Tier I: 1 Procedure PT Treatments $Therapeutic Exercise: 8-22 mins   PT G Codes:       Cindy Roberts, PT, DPT [phone]  Cindy Roberts 01/13/2015, 10:52 AM

## 2015-01-13 NOTE — Evaluation (Signed)
Occupational Therapy Evaluation Patient Details Name: Cindy Roberts MRN: [id number] DOB: Jan 19, 1941 Today's Date: 01/13/2015    History of Present Illness This patient is a 74 year old female who came to [facility] for a R TKR   Clinical Impression   This patient is a 74 year old female who came to [facility] for a R total knee replacement.  Patient lives in a 1 story home with one 1/2  step to enter.  She had been independent with ADL and functional mobility including driving.  He would benefit from Occupational Therapy for ADL/functioal mobility training.       Follow Up Recommendations  SNF    Equipment Recommendations       Recommendations for Other Services       Precautions / Restrictions Precautions Precautions: Fall;Knee Required Braces or Orthoses: Knee Immobilizer - Right Restrictions Weight Bearing Restrictions: Yes RLE Weight Bearing: Weight bearing as tolerated      Mobility Bed Mobility              Balance                                            ADL                                         General ADL Comments: Had been independent with ADL and functional mobility including driving. She now needs assist.  Patient practiced Donned/doffed socks and pants to knees (bulky wrap still in place). She required minimal to moderate assist and verbal cues for technique and safety.     Vision     Perception     Praxis      Pertinent Vitals/Pain Pain Assessment: 0-10 Pain Score: 7      Hand Dominance     Extremity/Trunk Assessment Upper Extremity Assessment Upper Extremity Assessment:  (B UE 4+/5)          Communication Communication Communication: No difficulties   Cognition Arousal/Alertness: Awake/alert Behavior During Therapy: WFL for tasks assessed/performed Overall Cognitive Status: Within Functional Limits for tasks assessed                     General Comments        Exercises     Shoulder Instructions      Home Living Family/patient expects to be discharged to:: Skilled nursing facility Living Arrangements: Other relatives                                      Prior Functioning/Environment Level of Independence: Independent        Comments: Pt was able to get out of the house and run errands, etc    OT Diagnosis: Acute pain   OT Problem List: Pain;Impaired balance (sitting and/or standing)   OT Treatment/Interventions: Self-care/ADL training    OT Goals(Current goals can be found in the care plan section) Acute Rehab OT Goals Patient Stated Goal: Would rather go home but thinks she needs rehab Time For Goal Achievement: 01/27/15 Potential to Achieve Goals: Good  OT Frequency: Min 1X/week   Barriers to D/C:  Co-evaluation              End of Session Equipment Utilized During Treatment:  (Hip kit)  Activity Tolerance:   Patient left: in chair;with call bell/phone within reach;with chair alarm set   Time: 5247-9980 OT Time Calculation (min): 20 min Charges:  OT General Charges $OT Visit: 1 Procedure OT Evaluation $Initial OT Evaluation Tier I: 1 Procedure OT Treatments $Self Care/Home Management : 8-22 mins G-Codes:    Myrene Galas, MS/OTR/L  01/13/2015, 11:37 AM

## 2015-01-13 NOTE — Anesthesia Postprocedure Evaluation (Signed)
  Anesthesia Post-op Note  Patient: Cindy Roberts  Procedure(s) Performed: Procedure(s): TOTAL KNEE ARTHROPLASTY (Right)  Anesthesia type:Spinal  Patient location: PACU  Post pain: Pain level controlled  Post assessment: Post-op Vital signs reviewed, Patient's Cardiovascular Status Stable, Respiratory Function Stable, Patent Airway and No signs of Nausea or vomiting  Post vital signs: Reviewed and stable  Last Vitals:  Filed Vitals:   01/13/15 0347  BP: 182/70  Pulse: 79  Temp:   Resp:     Level of consciousness: awake, alert  and patient cooperative  Complications: No apparent anesthesia complications

## 2015-01-14 LAB — BASIC METABOLIC PANEL
Anion gap: 8 (ref 5–15)
BUN: 16 mg/dL (ref 6–20)
CO2: 25 mmol/L (ref 22–32)
Calcium: 8.6 mg/dL — ABNORMAL LOW (ref 8.9–10.3)
Chloride: 102 mmol/L (ref 101–111)
Creatinine, Ser: 1.16 mg/dL — ABNORMAL HIGH (ref 0.44–1.00)
GFR calc Af Amer: 54 mL/min — ABNORMAL LOW (ref >60)
GFR calc non Af Amer: 46 mL/min — ABNORMAL LOW (ref >60)
Glucose, Bld: 127 mg/dL — ABNORMAL HIGH (ref 65–99)
POTASSIUM: 4.3 mmol/L (ref 3.5–5.1)
SODIUM: 135 mmol/L (ref 135–145)

## 2015-01-14 LAB — HEMOGLOBIN: Hemoglobin: 10.6 g/dL — ABNORMAL LOW (ref 12.0–16.0)

## 2015-01-14 MED ORDER — OXYCODONE HCL 5 MG PO TABS: 5.0000 mg | ORAL_TABLET | ORAL | Status: DC | PRN

## 2015-01-14 NOTE — Progress Notes (Signed)
Physical Therapy Treatment Patient Details Name: Cindy Roberts MRN: [id number] DOB: Oct 20, 1940 Today's Date: 01/14/2015    History of Present Illness 74 year old female s/p R TKA    PT Comments    Pt continues to be pain limited and though she shows good effort she is very slow, guarded and labored with ambulation and is unable to even try more than 25 ft this AM.  She has good ROM in the surgical knee, but still lacks a lot of quad control and needs assist for simple bed mobility acts.   Follow Up Recommendations  SNF     Equipment Recommendations       Recommendations for Other Services       Precautions / Restrictions Precautions Precautions: Fall Required Braces or Orthoses: Knee Immobilizer - Right Restrictions Weight Bearing Restrictions: Yes RLE Weight Bearing: Weight bearing as tolerated    Mobility  Bed Mobility Overal bed mobility: Needs Assistance Bed Mobility: Sit to Supine     Supine to sit: Mod assist Sit to supine: Mod assist   General bed mobility comments: Again pt shows very good effort, but is still very weak with R LE AROM and also needs a lot of UE use and time to get trunk to upright.    Transfers Overall transfer level: Needs assistance Equipment used: Rolling walker (2 wheeled)   Sit to Stand: Min assist         General transfer comment: KI donned  Ambulation/Gait Ambulation/Gait assistance: Mod assist Ambulation Distance (Feet): 25 Feet Assistive device: Rolling walker (2 wheeled)       General Gait Details: Pt continues to be very slow and hesitant with limited ambulation.  She shows good effort but remains heavily reliant on the walker and is not able to push herself to go any further despite much cuing.    Stairs            Wheelchair Mobility    Modified Rankin (Stroke Patients Only)       Balance                                    Cognition Arousal/Alertness: Awake/alert                           Exercises Total Joint Exercises Ankle Circles/Pumps: AROM;10 reps Quad Sets: AROM;Strengthening;10 reps Heel Slides: AROM;10 reps Hip ABduction/ADduction: AROM;10 reps Straight Leg Raises: PROM;5 reps Knee Flexion: PROM;5 reps Goniometric ROM: 1-88    General Comments        Pertinent Vitals/Pain Pain Score: 8     Home Living                      Prior Function            PT Goals (current goals can now be found in the care plan section) Progress towards PT goals: Progressing toward goals    Frequency  BID    PT Plan Current plan remains appropriate    Co-evaluation             End of Session Equipment Utilized During Treatment: Gait belt Activity Tolerance: Patient limited by pain;Patient limited by fatigue Patient left: with chair alarm set     Time: [phone] PT Time Calculation (min) (ACUTE ONLY): 42 min  Charges:  $Gait Training: 8-22 mins $Therapeutic Exercise: 8-22 mins $  Therapeutic Activity: 8-22 mins                    G Codes:     Cindy Roberts, PT, DPT [phone]  Kreg Shropshire 01/14/2015, 11:24 AM

## 2015-01-14 NOTE — Progress Notes (Signed)
CSW was notified by SNF [facility] in [place] that they have received insurance preauth and Pt can go to SNF on Sunday.  CSW updated Pt and she is very pleased. MD aware. Pt states that her son will take her to SNF in car.  CSW will follow up on Sunday to assist with dc coordination.   Toma Copier, [place]

## 2015-01-14 NOTE — Clinical Social Work Placement (Signed)
   CLINICAL SOCIAL WORK PLACEMENT  NOTE  Date:  01/14/2015  Patient Details  Name: Cindy Roberts MRN: [id number] Date of Birth: 10-Aug-1940  Clinical Social Work is seeking post-discharge placement for this patient at the [place] level of care (*CSW will initial, date and re-position this form in  chart as items are completed):  Yes   Patient/family provided with [facility] Work Department's list of facilities offering this level of care within the geographic area requested by the patient (or if unable, by the patient's family).  Yes   Patient/family informed of their freedom to choose among providers that offer the needed level of care, that participate in Medicare, Medicaid or managed care program needed by the patient, have an available bed and are willing to accept the patient.  Yes   Patient/family informed of [facility]'s ownership interest in [facility] and [facility], as well as of the fact that they are under no obligation to receive care at these facilities.  PASRR submitted to EDS on       PASRR number received on       Existing PASRR number confirmed on       FL2 transmitted to all facilities in geographic area requested by pt/family on 01/13/15     FL2 transmitted to all facilities within larger geographic area on       Patient informed that his/her managed care company has contracts with or will negotiate with certain facilities, including the following:        Yes   Patient/family informed of bed offers received.  Patient chooses bed at  [facility])     Physician recommends and patient chooses bed at      Patient to be transferred to  [facility]) on 01/15/15.  Patient to be transferred to facility by  [facility])     Patient family notified on   of transfer.  Name of family member notified:        PHYSICIAN       Additional Comment:    _______________________________________________ Alonna Buckler, LCSW 01/14/2015, 5:00 PM

## 2015-01-14 NOTE — Progress Notes (Signed)
  Subjective: 2 Days Post-Op Procedure(s) (LRB): TOTAL KNEE ARTHROPLASTY (Right) Patient reports pain as moderate.   Patient is well, and has had no acute complaints or problems Plan is to go Skilled nursing facility after hospital stay. Negative for chest pain and shortness of breath Fever: no Gastrointestinal:Negative for nausea and vomiting  Objective: Vital signs in last 24 hours: Temp:  [98.2 F (36.8 C)-99.3 F (37.4 C)] 99 F (37.2 C) (08/13 0713) Pulse Rate:  [69-86] 86 (08/13 0713) Resp:  [16-20] 16 (08/13 0713) BP: (155-169)/(66-73) 159/69 mmHg (08/13 0713) SpO2:  [94 %-100 %] 97 % (08/13 0713)  Intake/Output from previous day:  Intake/Output Summary (Last 24 hours) at 01/14/15 0746 Last data filed at 01/14/15 0715  Gross per 24 hour  Intake 821.25 ml  Output   1400 ml  Net -578.75 ml    Intake/Output this shift: Total I/O In: -  Out: 250 [Urine:250]  Labs:  Recent Labs  01/13/15 0341 01/14/15 0319  HGB 11.5* 10.6*    Recent Labs  01/13/15 0341  WBC 11.5*  RBC 3.91  HCT 35.8  PLT 155    Recent Labs  01/13/15 0341 01/14/15 0319  NA 137 135  K 4.7 4.3  CL 104 102  CO2 24 25  BUN 23* 16  CREATININE 1.16* 1.16*  GLUCOSE 133* 127*  CALCIUM 8.8* 8.6*   No results for input(s): LABPT, INR in the last 72 hours.   EXAM General - Patient is Alert, Appropriate and Oriented Extremity - Neurologically intact ABD soft Sensation intact distally Dorsiflexion/Plantar flexion intact Incision: dressing C/D/I No cellulitis present Dressing/Incision - clean, dry, no drainage Motor Function - intact, moving foot and toes well on exam.   Abdomen is soft, non-distended and non-tender.   Bulky dressing changed today.  Inc  Past Medical History  Diagnosis Date  . Hypertension   . GERD (gastroesophageal reflux disease)   . Arthritis   . Gout     Assessment/Plan: 2 Days Post-Op Procedure(s) (LRB): TOTAL KNEE ARTHROPLASTY (Right) Active  Problems:   Primary osteoarthritis of knee  Estimated body mass index is 45.51 kg/(m^2) as calculated from the following:   Height as of this encounter: 5\' 2"  (1.575 m).   Weight as of this encounter: 112.9 kg (248 lb 14.4 oz). Advance diet Up with therapy   Pt is doing well. Needs a BM before being discharged.  DVT Prophylaxis - Aspirin, Foot Pumps, TED hose and Plavix Weight-Bearing as tolerated to right leg  J. Cameron Proud, PA-C [facility] 01/14/2015, 7:46 AM

## 2015-01-14 NOTE — Discharge Instructions (Signed)

## 2015-01-14 NOTE — Progress Notes (Signed)
Physical Therapy Treatment Patient Details Name: Cindy Roberts MRN: [id number] DOB: [birth year] Today's Date: 01/14/2015    History of Present Illness This patient is a 74 year old female who came to [facility] for a R TKR    PT Comments    Pt continues to struggle with WBing, has very limited ambulation and also continues to lack much quad control.  She shows good effort but still needs assist with mobility and is also having quite a bit of pain.   Follow Up Recommendations  SNF     Equipment Recommendations       Recommendations for Other Services       Precautions / Restrictions Precautions Precautions: Fall Required Braces or Orthoses: Knee Immobilizer - Right Restrictions Weight Bearing Restrictions: Yes RLE Weight Bearing: Weight bearing as tolerated    Mobility  Bed Mobility Overal bed mobility: Needs Assistance Bed Mobility: Sit to Supine       Sit to supine: Mod assist (to raise R LE into bed)      Transfers Overall transfer level: Needs assistance Equipment used: Rolling walker (2 wheeled)   Sit to Stand: Min assist         General transfer comment: KI donned  Ambulation/Gait Ambulation/Gait assistance: Min assist Ambulation Distance (Feet): 10 Feet Assistive device: Rolling walker (2 wheeled)       General Gait Details: Pt still having hesitancy and pain that limits ambulation.  She shows good effort but remains heavily reliant on the walker and is only able to do minimal ambulation.    Stairs            Wheelchair Mobility    Modified Rankin (Stroke Patients Only)       Balance                                    Cognition Arousal/Alertness: Awake/alert                          Exercises Total Joint Exercises Ankle Circles/Pumps: AROM;10 reps Quad Sets: AROM;Strengthening;10 reps Heel Slides: AROM;10 reps Hip ABduction/ADduction: AROM;10 reps Straight Leg Raises: PROM;5 reps Knee Flexion: PROM;5  reps    General Comments        Pertinent Vitals/Pain Pain Score: 7     Home Living                      Prior Function            PT Goals (current goals can now be found in the care plan section) Progress towards PT goals: Progressing toward goals    Frequency  BID    PT Plan Current plan remains appropriate    Co-evaluation             End of Session           Time: 1350-1416 PT Time Calculation (min) (ACUTE ONLY): 26 min  Charges:  $Gait Training: 8-22 mins $Therapeutic Exercise: 8-22 mins                    G Codes:     Wayne Both, PT, DPT [phone]  Kreg Shropshire 01/14/2015, 10:44 AM

## 2015-01-14 NOTE — Progress Notes (Signed)
Physical Therapy Treatment Patient Details Name: Cindy Roberts MRN: [id number] DOB: 1940/06/18 Today's Date: 01/14/2015    History of Present Illness 74 year old female s/p R TKA    PT Comments    Pt shows good effort, but is still very fatigued with limited ambulation and is unable to really use UEs well enough to really advance L foot appropriately enough to be able to walk any significant distance.  She is showing some minimal quad strength improvements but is pain limited and generally still quite weak.    Follow Up Recommendations  SNF     Equipment Recommendations       Recommendations for Other Services       Precautions / Restrictions Precautions Precautions: Fall Required Braces or Orthoses: Knee Immobilizer - Right Restrictions RLE Weight Bearing: Weight bearing as tolerated    Mobility  Bed Mobility Overal bed mobility: Needs Assistance Bed Mobility: Sit to Supine       Sit to supine: Mod assist      Transfers Overall transfer level: Needs assistance Equipment used: Rolling walker (2 wheeled)   Sit to Stand: Min assist            Ambulation/Gait Ambulation/Gait assistance: Mod assist Ambulation Distance (Feet): 20 Feet Assistive device: Rolling walker (2 wheeled)       General Gait Details: Pt continues to be very slow and hesitant with limited ambulation.  She shows good effort initially but remains heavily reliant on the walker and is not able to push herself to go any further despite much cuing.    Stairs            Wheelchair Mobility    Modified Rankin (Stroke Patients Only)       Balance                                    Cognition Arousal/Alertness: Awake/alert                          Exercises Total Joint Exercises Ankle Circles/Pumps: AROM;10 reps Quad Sets: AROM;Strengthening;10 reps Heel Slides: AROM;10 reps Hip ABduction/ADduction: AROM;10 reps Straight Leg Raises: PROM;5  reps Knee Flexion: PROM;5 reps    General Comments        Pertinent Vitals/Pain Pain Score: 7     Home Living                      Prior Function            PT Goals (current goals can now be found in the care plan section) Progress towards PT goals: Progressing toward goals    Frequency  BID    PT Plan Current plan remains appropriate    Co-evaluation             End of Session Equipment Utilized During Treatment: Gait belt Activity Tolerance: Patient limited by pain;Patient limited by fatigue Patient left: with chair alarm set     Time: 1310-1335 PT Time Calculation (min) (ACUTE ONLY): 25 min  Charges:  $Gait Training: 8-22 mins $Therapeutic Exercise: 8-22 mins                    G Codes:     Wayne Both, PT, DPT [phone]  Kreg Shropshire 01/14/2015, 3:01 PM

## 2015-01-14 NOTE — Clinical Social Work Note (Signed)
Late entry 01/14/15 for 01/13/15   Clinical Social Work Assessment  Patient Details  Name: Cindy Roberts MRN: [id number] Date of Birth: Jul 17, 1940  Date of referral:  01/13/15               Reason for consult:  Facility Placement                Permission sought to share information with:  Family Supports Permission granted to share information::  Yes, Verbal Permission Granted  Name::        Agency::     Relationship::  Adult Children   Contact Information:     Housing/Transportation Living arrangements for the past 2 months:  Single Family Home Source of Information:  Patient, Adult Children Patient Interpreter Needed:  None Criminal Activity/Legal Involvement Pertinent to Current Situation/Hospitalization:  No - Comment as needed Significant Relationships:  Adult Children, Siblings Lives with:  Siblings Do you feel safe going back to the place where you live?  Yes Need for family participation in patient care:  Yes (Comment)  Care giving concerns: Pt lives with younger brother (49), he is able to assist if needed.   Social Worker assessment / plan:  Holiday representative met with Pt and her children/grandchildren in the room. Pt was in chair and able to participate in discharge planning. Pt is retired from [employer] after 35 years. She has 6 adult children (all boys), and several grandchildren. Pt lives in [place] but does not want to go to SNF in [place]. Pt and her family request [place]. CSW began SNF bed search process, requesting SNFs to move quickly with insurance authorization as Pt would likely be ready on Sunday. Also discussed with Pt potential plans to go home if she progresses to [facility] level of care by discharge. CSW will follow up as Pt's dc planning progresses.   Employment status:  Disabled (Comment on whether or not currently receiving Disability), Retired Nurse, adult PT Recommendations:  [place] /  Referral to community resources:  [place]  Patient/Family's Response to care:  Pt's family in room, concern with Pt going home. Pt prefers to go home but does not feel like she "should go home."   Patient/Family's Understanding of and Emotional Response to Diagnosis, Current Treatment, and Prognosis:  Pt is motivated for rehab and recovery. Pt and family making jokes and have good sense of humor while providing Pt support.   Emotional Assessment Appearance:  Appears younger than stated age Attitude/Demeanor/Rapport:   (cooperative) Affect (typically observed):  Accepting, Anxious Orientation:  Oriented to Self, Oriented to Place, Oriented to  Time, Oriented to Situation Alcohol / Substance use:  Never Used Psych involvement (Current and /or in the community):  No (Comment)  Discharge Needs  Concerns to be addressed:  Adjustment to Illness Readmission within the last 30 days:  No Current discharge risk:  Dependent with Mobility Barriers to Discharge:  Ship broker, Continued Medical Work up   [employer], LCSW 01/14/2015, 2:21 PM

## 2015-01-15 NOTE — Clinical Social Work Placement (Signed)
   CLINICAL SOCIAL WORK PLACEMENT  NOTE  Date:  01/15/2015  Patient Details  Name: Cindy Roberts MRN: [id number] Date of Birth: 11/24/1940  Clinical Social Work is seeking post-discharge placement for this patient at the [place] level of care (*CSW will initial, date and re-position this form in  chart as items are completed):  Yes   Patient/family provided with [facility] Work Department's list of facilities offering this level of care within the geographic area requested by the patient (or if unable, by the patient's family).  Yes   Patient/family informed of their freedom to choose among providers that offer the needed level of care, that participate in Medicare, Medicaid or managed care program needed by the patient, have an available bed and are willing to accept the patient.  Yes   Patient/family informed of [facility]'s ownership interest in [facility] and [facility], as well as of the fact that they are under no obligation to receive care at these facilities.  PASRR submitted to EDS on       PASRR number received on       Existing PASRR number confirmed on       FL2 transmitted to all facilities in geographic area requested by pt/family on 01/13/15     FL2 transmitted to all facilities within larger geographic area on       Patient informed that his/her managed care company has contracts with or will negotiate with certain facilities, including the following:        Yes   Patient/family informed of bed offers received.  Patient chooses bed at  [facility])     Physician recommends and patient chooses bed at      Patient to be transferred to  [facility]) on 01/15/15.  Patient to be transferred to facility by [facility]     Patient family notified on 01/15/15 of transfer.  Name of family member notified:  grandaughter at bedside     PHYSICIAN       Additional Comment:     _______________________________________________ Alonna Buckler, LCSW 01/15/2015, 12:05 PM

## 2015-01-15 NOTE — Progress Notes (Signed)
Patient ID: Cindy Roberts, female   DOB: Jun 14, 1940, 74 y.o.   MRN: [id number] PROGRESS NOTE  PATIENT NAME: Cindy Roberts DOB: 26-Aug-1940  MRN: [id number]  Subjective: Patient reports only mild pain.  Objective: Vital signs in last 24 hours: Temp:  [98.5 F (36.9 C)-99.5 F (37.5 C)] 99.1 F (37.3 C) (08/14 0744) Pulse Rate:  [74-80] 79 (08/14 0744) Resp:  [18-20] 18 (08/14 0744) BP: (118-148)/(50-62) 148/51 mmHg (08/14 0744) SpO2:  [96 %-100 %] 100 % (08/14 0744)  Intake/Output from previous day: 08/13 0701 - 08/14 0700 In: 240 [P.O.:240] Out: 450 [Urine:450]   Recent Labs  01/13/15 0341 01/14/15 0319  WBC 11.5*  --   HGB 11.5* 10.6*  HCT 35.8  --   PLT 155  --   K 4.7 4.3  CL 104 102  CO2 24 25  BUN 23* 16  CREATININE 1.16* 1.16*  GLUCOSE 133* 127*  CALCIUM 8.8* 8.6*    EXAM General - Patient is Alert, Appropriate and Oriented Extremity - Neurologically intact, negative [place] Dressing - dressing C/D/I Motor Function - intact, moving foot and toes well on exam.   Assessment: S/P right total knee arthroplasty    Active Problems:   Primary osteoarthritis of knee  Plan: Discharge to SNF [place] singed and on chart.  Mattye Verdone P. Holley Bouche M.D.

## 2015-01-15 NOTE — Progress Notes (Signed)
Physical Therapy Treatment Patient Details Name: Cindy Roberts MRN: [id number] DOB: 24-Aug-1940 Today's Date: 01/15/2015    History of Present Illness 74 year old female s/p R TKA    PT Comments    Pt continues to show good effort but struggles to fully lock out knee with quad sets. She is able to increase ambulation distance today but is very fatigued with the effort.  She continues to work hard but is still pain limited with [place] on the R and generally continues to need some level of assist with all functional mobility/ambulation.   Follow Up Recommendations  SNF     Equipment Recommendations       Recommendations for Other Services       Precautions / Restrictions Precautions Precautions: Fall Required Braces or Orthoses: Knee Immobilizer - Right Restrictions Weight Bearing Restrictions: Yes RLE Weight Bearing: Weight bearing as tolerated    Mobility  Bed Mobility Overal bed mobility: Needs Assistance Bed Mobility: Supine to Sit     Supine to sit: Min assist;Mod assist        Transfers Overall transfer level: Needs assistance Equipment used: Rolling walker (2 wheeled) Transfers: Sit to/from Stand Sit to Stand: Min assist            Ambulation/Gait Ambulation/Gait assistance: Min assist Ambulation Distance (Feet): 50 Feet Assistive device: Rolling walker (2 wheeled) (chair following)       General Gait Details: Pt continues to be very slow and hesitant but is able to push herself to go a little bit farther.  She shows good effort but remains heavily reliant on the walker and continues to struggle to consistently advance L foot past right.    Stairs            Wheelchair Mobility    Modified Rankin (Stroke Patients Only)       Balance                                    Cognition Arousal/Alertness: Awake/alert Behavior During Therapy: WFL for tasks assessed/performed Overall Cognitive Status: Within Functional Limits  for tasks assessed                      Exercises Total Joint Exercises Ankle Circles/Pumps: AROM;10 reps Quad Sets: AROM;10 reps;AAROM Heel Slides: 10 reps;AAROM;AROM Hip ABduction/ADduction: AROM;10 reps;AAROM Straight Leg Raises: PROM;5 reps Knee Flexion: PROM;5 reps Goniometric ROM: 0-85    General Comments        Pertinent Vitals/Pain Pain Score: 5     Home Living                      Prior Function            PT Goals (current goals can now be found in the care plan section) Progress towards PT goals: Progressing toward goals    Frequency  BID    PT Plan Current plan remains appropriate    Co-evaluation             End of Session Equipment Utilized During Treatment: Gait belt Activity Tolerance: Patient limited by pain;Patient limited by fatigue Patient left: with chair alarm set     Time: [id number] PT Time Calculation (min) (ACUTE ONLY): 27 min  Charges:  $Gait Training: 8-22 mins $Therapeutic Exercise: 8-22 mins  G Codes:     Wayne Both, PT, DPT [phone]  Kreg Shropshire 01/15/2015, 12:12 PM

## 2015-01-15 NOTE — Progress Notes (Signed)
CSW notified that Pt has been medically cleared for dc to SNF [facility]. Pt was going to go by car but is not able to move knee to sitting position. Pt also 2 person max assist. Pt is agreeable to non-emergent EMS transport. CSW contacted [facility] for transport.  RN called report. DC summary and scripts faxed. DC packet prepared for transport. Pt's family is in the room. Pt is pleased with dc plan today.   No further CSW needs at this time.  Toma Copier, [place]

## 2015-01-15 NOTE — Progress Notes (Signed)
Patient discharge via stretcher with transport staff.

## 2015-01-15 NOTE — Care Management Note (Addendum)
Case Management Note  Patient Details  Name: Cindy Roberts MRN: [id number] Date of Birth: [birth year]  Subjective/Objective:    Per Social Worker note from yesterday, [name] will be discharged today to [facility] rehab.  [name] reports that she already has a rolling walker and bedside toilet for use when she returns home.                  Action/Plan:   Expected Discharge Date:                  Expected Discharge Plan:     In-House Referral:     Discharge planning Services  CM Consult  Post Acute Care Choice:  Home Health, Durable Medical Equipment Choice offered to:  Patient, Adult Children  DME Arranged:    DME Agency:     HH Arranged:    [place] Agency:     Status of Service:  In process, will continue to follow  Medicare Important Message Given:    Date Medicare IM Given:    Medicare IM give by:    Date Additional Medicare IM Given:    Additional Medicare Important Message give by:     If discussed at [place] of Stay Meetings, dates discussed:    Additional Comments:  Annell Canty A, RN 01/15/2015, 8:24 AM

## 2015-01-15 NOTE — Discharge Summary (Signed)
Physician Discharge Summary  Patient ID: Cindy Roberts MRN: [id number] DOB/AGE: [id number] y.o.  Admit date: 01/12/2015 Discharge date: 01/15/2015  Admission Diagnoses:  degenerative oa   Discharge Diagnoses: Patient Active Problem List   Diagnosis Date Noted  . Primary osteoarthritis of knee 01/12/2015    Past Medical History  Diagnosis Date  . Hypertension   . GERD (gastroesophageal reflux disease)   . Arthritis   . Gout      Transfusion: None   Consultants (if any):   none  Discharged Condition: Improved  Hospital Course: Cindy Roberts is an 74 y.o. female who was admitted 01/12/2015 with a diagnosis of osteoarthritis of the right knee and went to the operating room on 01/12/2015 and underwent right total knee arthroplasty under spinal anesthesia as per Dr. Gordy Levan.   Surgeries:Procedure(s): TOTAL KNEE ARTHROPLASTY on 01/12/2015  Patient tolerated the surgery well. No complications .Patient was taken to PACU where she was stabilized and then transferred to the orthopedic floor.  Patient started on aspirin and Plavix postoperatively. Foot pumps applied bilaterally at 80 mm hgb. Heels elevated off bed with rolled towels. No evidence of DVT. Calves non tender. Negative Homan. Physical therapy started on day #1 for gait training and transfer with OT starting on  day #1 for ADL and assisted devices. Patient has made slow but steady progress with therapy. It was felt that she would benefit from continued rehabilitation in a skilled nursing facility.   She was given perioperative antibiotics:  Anti-infectives    Start     Dose/Rate Route Frequency Ordered Stop   01/12/15 1130  ceFAZolin (ANCEF) IVPB 2 g/50 mL premix     2 g 100 mL/hr over 30 Minutes Intravenous Every 6 hours 01/12/15 1118 01/12/15 1751   01/12/15 0630  ceFAZolin (ANCEF) IVPB 2 g/50 mL premix     2 g 100 mL/hr over 30 Minutes Intravenous  Once 01/12/15 0620 01/12/15 0750   01/12/15 0544  ceFAZolin  (ANCEF) 2-3 GM-% IVPB SOLR    Comments:  Idamae Lusher: cabinet override      01/12/15 0544 01/12/15 1744    .  She was given a AV1 foot pumps, aspirin and Plavix, TEDs stockings, and early ambulation for DVT prophylaxis.  She benefited maximally from the hospital stay and there were no complications.    Recent vital signs:  Filed Vitals:   01/15/15 0744  BP: 148/51  Pulse: 79  Temp: 99.1 F (37.3 C)  Resp: 18    Recent laboratory studies:  Lab Results  Component Value Date   HGB 10.6* 01/14/2015   HGB 11.5* 01/13/2015   HGB 12.7 12/28/2014   Lab Results  Component Value Date   WBC 11.5* 01/13/2015   PLT 155 01/13/2015   Lab Results  Component Value Date   INR 0.96 12/28/2014   Lab Results  Component Value Date   NA 135 01/14/2015   K 4.3 01/14/2015   CL 102 01/14/2015   CO2 25 01/14/2015   BUN 16 01/14/2015   CREATININE 1.16* 01/14/2015   GLUCOSE 127* 01/14/2015    Discharge Medications:     Medication List    TAKE these medications        allopurinol 300 MG tablet  Commonly known as:  ZYLOPRIM  Take 300 mg by mouth daily.     aspirin EC 81 MG tablet  Take 81 mg by mouth daily.     atorvastatin 40 MG tablet  Commonly known as:  LIPITOR  Take 40 mg by mouth at bedtime.     clopidogrel 75 MG tablet  Commonly known as:  PLAVIX  Take 75 mg by mouth daily.     esomeprazole 40 MG capsule  Commonly known as:  NEXIUM  Take 40 mg by mouth as needed.     felodipine 10 MG 24 hr tablet  Commonly known as:  PLENDIL  Take 10 mg by mouth daily.     furosemide 20 MG tablet  Commonly known as:  LASIX  Take 20 mg by mouth daily.     isosorbide dinitrate 20 MG tablet  Commonly known as:  ISORDIL  Take 20 mg by mouth every evening.     metoprolol 50 MG tablet  Commonly known as:  LOPRESSOR  Take 50 mg by mouth 2 (two) times daily.     nabumetone 750 MG tablet  Commonly known as:  RELAFEN  Take 750 mg by mouth 2 (two) times daily as needed.      nitroGLYCERIN 0.4 MG SL tablet  Commonly known as:  NITROSTAT  Place 0.4 mg under the tongue every 5 (five) minutes as needed for chest pain. DO NOT EXCEED THREE DOSES , THEN CALL 911     oxyCODONE 5 MG immediate release tablet  Commonly known as:  Oxy IR/ROXICODONE  Take 1-2 tablets (5-10 mg total) by mouth every 4 (four) hours as needed for breakthrough pain.     quinapril 40 MG tablet  Commonly known as:  ACCUPRIL  Take 40 mg by mouth daily.     traMADol 50 MG tablet  Commonly known as:  ULTRAM  Take 50 mg by mouth 3 (three) times daily as needed.     traZODone 100 MG tablet  Commonly known as:  DESYREL  Take 100 mg by mouth at bedtime.        Diagnostic Studies: Dg Knee 1-2 Views Right  01/12/2015   CLINICAL DATA:  Pain, postop  EXAM: RIGHT KNEE - 1-2 VIEW  COMPARISON:  None.  FINDINGS: Two views of the right knee submitted. There is right knee prosthesis anatomic alignment. Anteromedial skin staples are noted. Postsurgical changes periarticular soft tissue air.  IMPRESSION: Right knee prosthesis with anatomic alignment. Postsurgical changes are noted.   Electronically Signed   By: Lahoma Crocker M.D.   On: 01/12/2015 10:44    Disposition: [facility] in [place]       Follow-up Information    Follow up with Dorise Hiss CHRISTOPHER, PA-C.   Specialties:  Orthopedic Surgery, Emergency Medicine   Why:  For staple removal, For wound re-check   Contact information:   [place] [address] [phone]        Signed: Dereck Leep 01/15/2015, 10:17 AM

## 2015-01-15 NOTE — Progress Notes (Signed)
Report called to Jocelyn Lamer, RN, honeycomb dressing sent with discharge packet. PTAR called by Baxter Flattery, Education officer, museum. Waiting for transport.

## 2015-01-16 ENCOUNTER — Encounter: Payer: Self-pay | Admitting: Student

## 2015-01-16 LAB — SURGICAL PATHOLOGY

## 2016-05-07 DIAGNOSIS — Z23 Encounter for immunization: Secondary | ICD-10-CM | POA: Diagnosis not present

## 2016-07-12 DIAGNOSIS — K5909 Other constipation: Secondary | ICD-10-CM | POA: Diagnosis not present

## 2016-07-12 DIAGNOSIS — K219 Gastro-esophageal reflux disease without esophagitis: Secondary | ICD-10-CM | POA: Diagnosis not present

## 2016-07-12 DIAGNOSIS — D649 Anemia, unspecified: Secondary | ICD-10-CM | POA: Diagnosis not present

## 2016-07-25 DIAGNOSIS — K635 Polyp of colon: Secondary | ICD-10-CM | POA: Diagnosis not present

## 2016-07-25 DIAGNOSIS — Z1211 Encounter for screening for malignant neoplasm of colon: Secondary | ICD-10-CM | POA: Diagnosis not present

## 2016-07-25 DIAGNOSIS — K621 Rectal polyp: Secondary | ICD-10-CM | POA: Diagnosis not present

## 2016-08-01 DIAGNOSIS — N183 Chronic kidney disease, stage 3 (moderate): Secondary | ICD-10-CM | POA: Diagnosis not present

## 2016-08-01 DIAGNOSIS — I10 Essential (primary) hypertension: Secondary | ICD-10-CM | POA: Diagnosis not present

## 2016-08-01 DIAGNOSIS — I2510 Atherosclerotic heart disease of native coronary artery without angina pectoris: Secondary | ICD-10-CM | POA: Diagnosis not present

## 2016-08-01 DIAGNOSIS — E559 Vitamin D deficiency, unspecified: Secondary | ICD-10-CM | POA: Diagnosis not present

## 2016-08-01 DIAGNOSIS — D649 Anemia, unspecified: Secondary | ICD-10-CM | POA: Diagnosis not present

## 2016-08-01 DIAGNOSIS — M170 Bilateral primary osteoarthritis of knee: Secondary | ICD-10-CM | POA: Diagnosis not present

## 2016-08-01 DIAGNOSIS — G479 Sleep disorder, unspecified: Secondary | ICD-10-CM | POA: Diagnosis not present

## 2016-08-01 DIAGNOSIS — K219 Gastro-esophageal reflux disease without esophagitis: Secondary | ICD-10-CM | POA: Diagnosis not present

## 2016-08-01 DIAGNOSIS — M109 Gout, unspecified: Secondary | ICD-10-CM | POA: Diagnosis not present

## 2016-08-01 DIAGNOSIS — E782 Mixed hyperlipidemia: Secondary | ICD-10-CM | POA: Diagnosis not present

## 2016-08-05 DIAGNOSIS — I25119 Atherosclerotic heart disease of native coronary artery with unspecified angina pectoris: Secondary | ICD-10-CM | POA: Diagnosis not present

## 2016-08-05 DIAGNOSIS — Z0000 Encounter for general adult medical examination without abnormal findings: Secondary | ICD-10-CM | POA: Diagnosis not present

## 2016-08-05 DIAGNOSIS — Z955 Presence of coronary angioplasty implant and graft: Secondary | ICD-10-CM | POA: Diagnosis not present

## 2016-08-05 DIAGNOSIS — M1711 Unilateral primary osteoarthritis, right knee: Secondary | ICD-10-CM | POA: Diagnosis not present

## 2016-08-05 DIAGNOSIS — Z972 Presence of dental prosthetic device (complete) (partial): Secondary | ICD-10-CM | POA: Diagnosis not present

## 2016-08-05 DIAGNOSIS — E785 Hyperlipidemia, unspecified: Secondary | ICD-10-CM | POA: Diagnosis not present

## 2016-08-05 DIAGNOSIS — K219 Gastro-esophageal reflux disease without esophagitis: Secondary | ICD-10-CM | POA: Diagnosis not present

## 2016-08-05 DIAGNOSIS — N939 Abnormal uterine and vaginal bleeding, unspecified: Secondary | ICD-10-CM | POA: Diagnosis not present

## 2016-08-05 DIAGNOSIS — N2581 Secondary hyperparathyroidism of renal origin: Secondary | ICD-10-CM | POA: Diagnosis not present

## 2016-08-05 DIAGNOSIS — I10 Essential (primary) hypertension: Secondary | ICD-10-CM | POA: Diagnosis not present

## 2016-08-05 DIAGNOSIS — M1A9XX0 Chronic gout, unspecified, without tophus (tophi): Secondary | ICD-10-CM | POA: Diagnosis not present

## 2016-08-06 IMAGING — CR DG KNEE 1-2V*R*
1 series · 2 of 2 positions shown · non-contrast
Comparison: None.

CLINICAL DATA: Pain, postop

EXAM:
RIGHT KNEE - 1-2 VIEW

[Series 1: ap · 0.17mm/px · 2 of 2 slices shown]
[im 1/2]
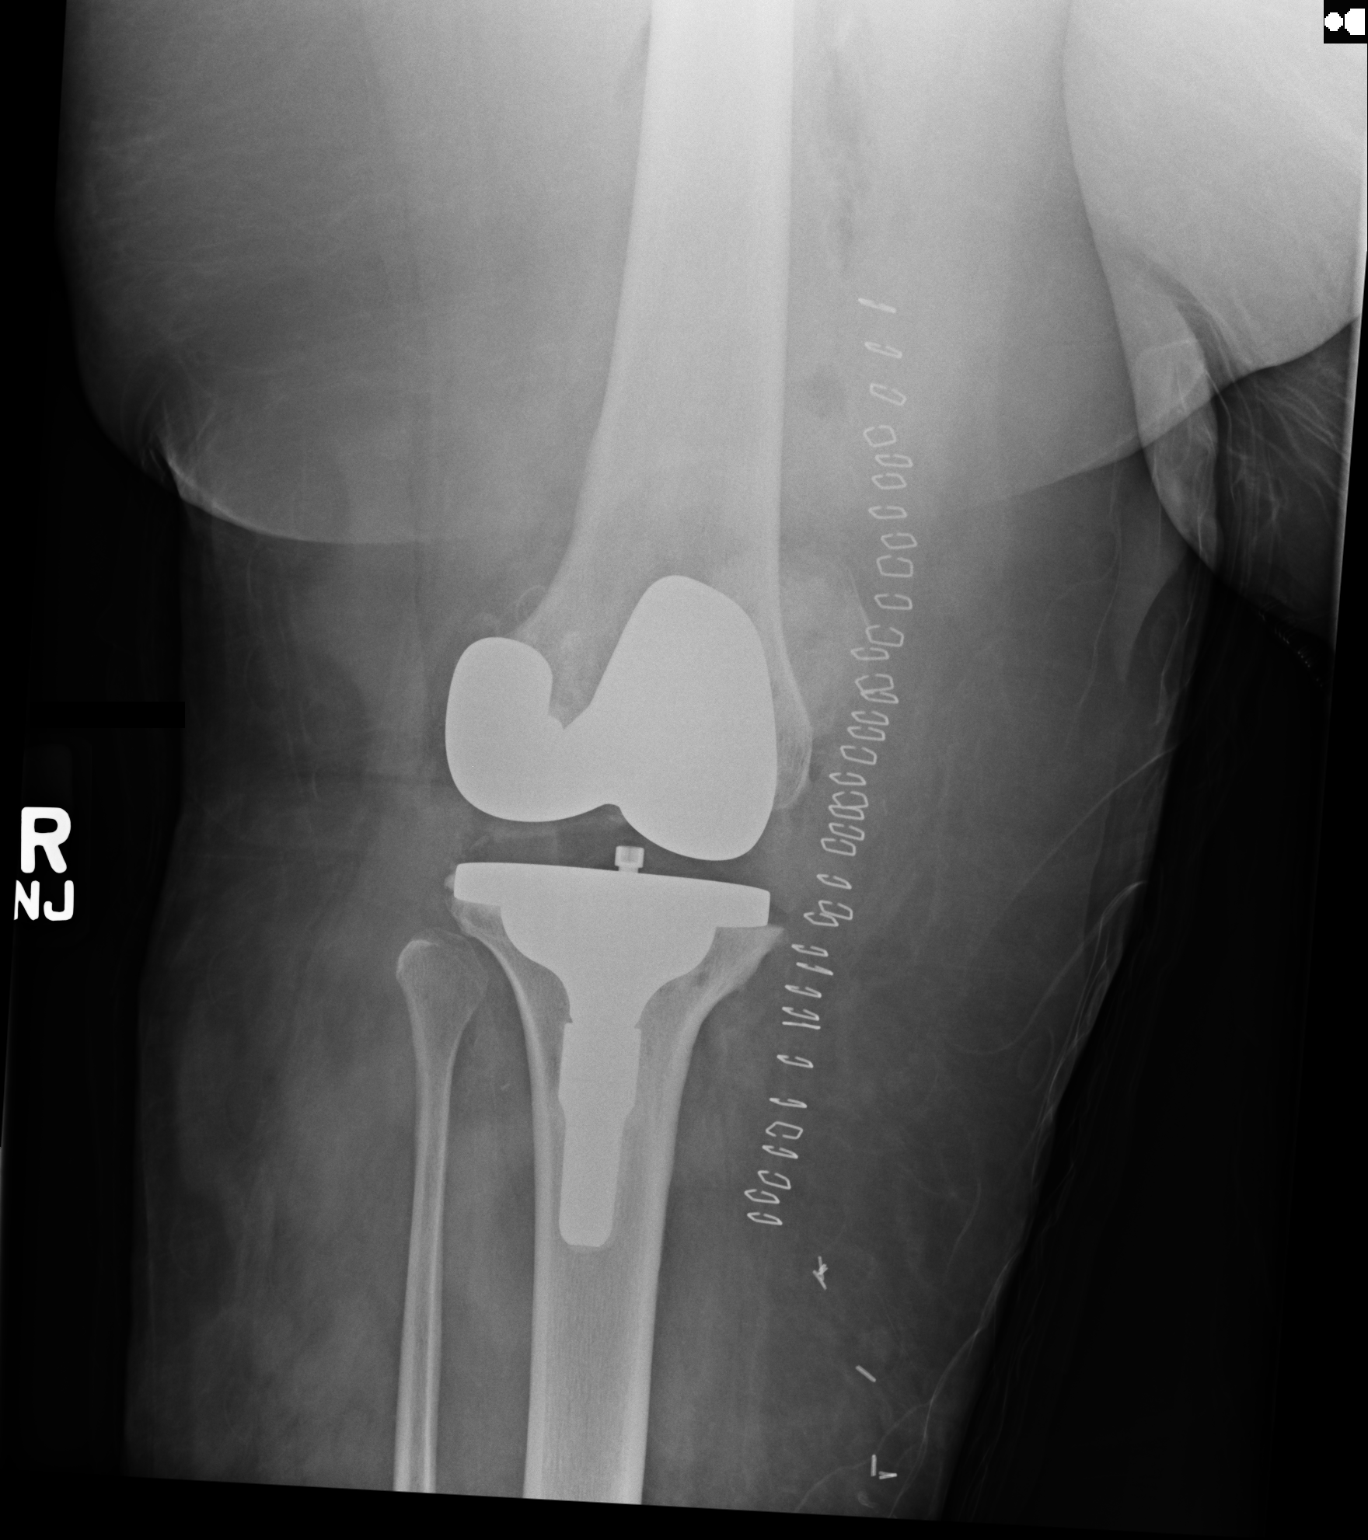
[im 2/2]
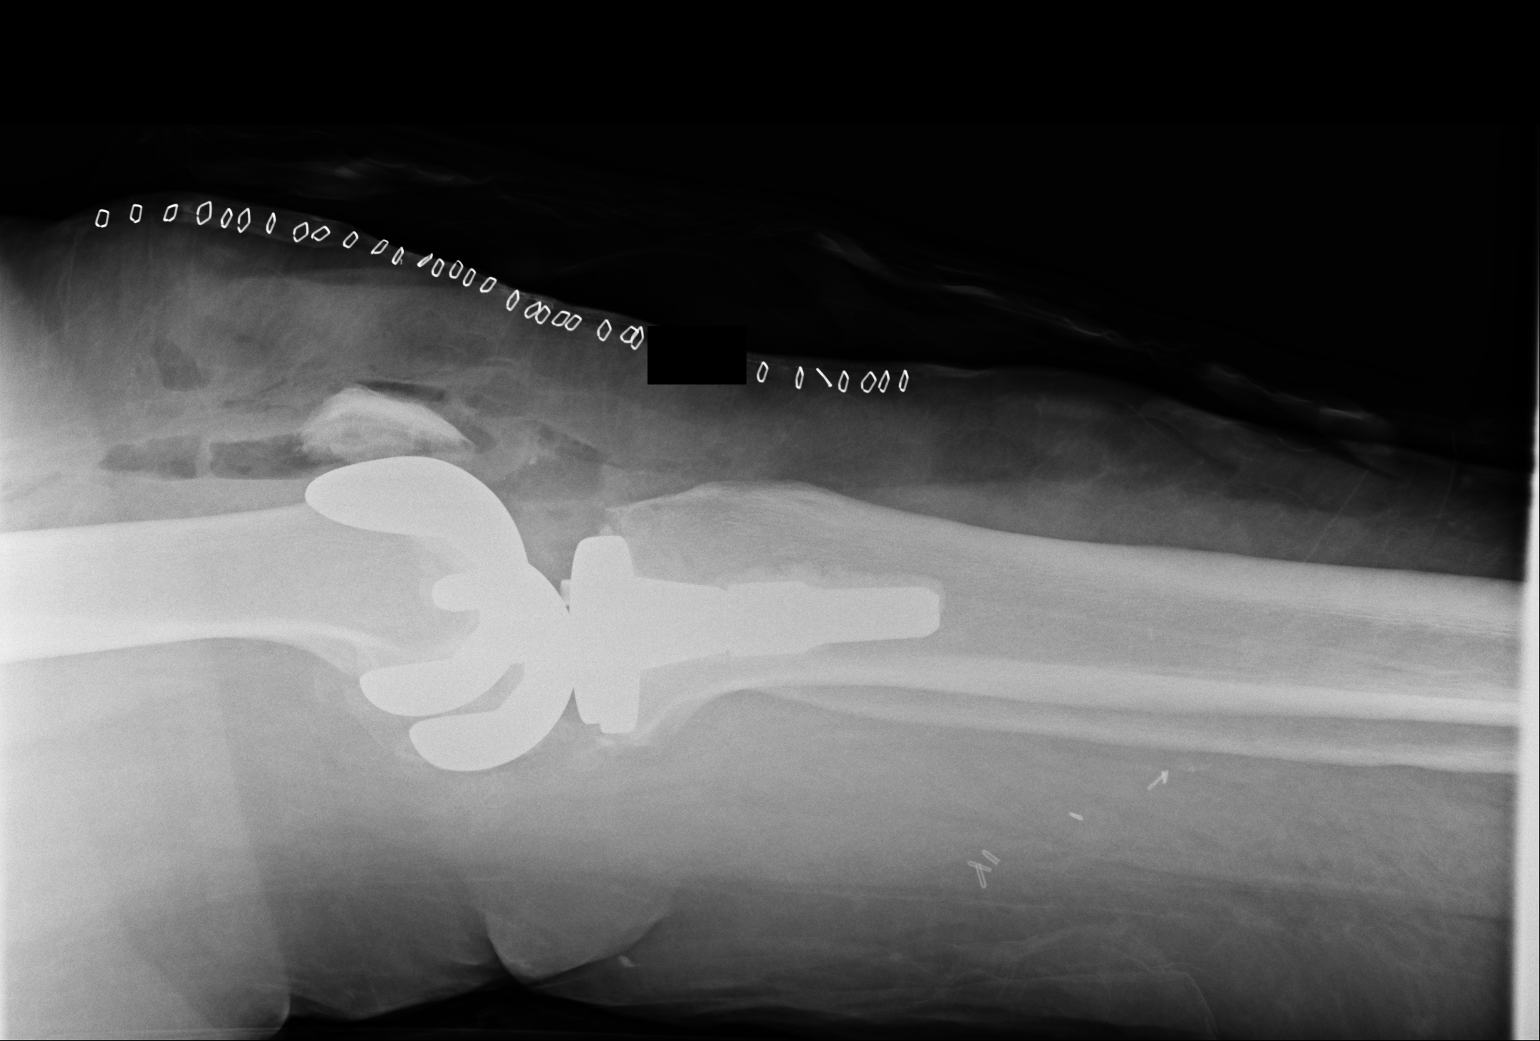

[2 of 2 positions shown; findings below may reference images not displayed]

FINDINGS: Two views of the right knee submitted. There is right knee
prosthesis anatomic alignment. Anteromedial skin staples are noted.
Postsurgical changes periarticular soft tissue air.
IMPRESSION: Right knee prosthesis with anatomic alignment. Postsurgical changes
are noted.

## 2016-08-16 DIAGNOSIS — M2550 Pain in unspecified joint: Secondary | ICD-10-CM | POA: Diagnosis not present

## 2016-08-16 DIAGNOSIS — M545 Low back pain: Secondary | ICD-10-CM | POA: Diagnosis not present

## 2016-09-19 DIAGNOSIS — N939 Abnormal uterine and vaginal bleeding, unspecified: Secondary | ICD-10-CM | POA: Diagnosis not present

## 2016-09-20 DIAGNOSIS — N183 Chronic kidney disease, stage 3 (moderate): Secondary | ICD-10-CM | POA: Diagnosis not present

## 2016-09-20 DIAGNOSIS — I10 Essential (primary) hypertension: Secondary | ICD-10-CM | POA: Diagnosis not present

## 2016-09-20 DIAGNOSIS — E782 Mixed hyperlipidemia: Secondary | ICD-10-CM | POA: Diagnosis not present

## 2016-09-20 DIAGNOSIS — I2510 Atherosclerotic heart disease of native coronary artery without angina pectoris: Secondary | ICD-10-CM | POA: Diagnosis not present

## 2016-11-11 DIAGNOSIS — Z01818 Encounter for other preprocedural examination: Secondary | ICD-10-CM | POA: Diagnosis not present

## 2016-11-11 DIAGNOSIS — N950 Postmenopausal bleeding: Secondary | ICD-10-CM | POA: Diagnosis not present

## 2016-11-13 DIAGNOSIS — N950 Postmenopausal bleeding: Secondary | ICD-10-CM | POA: Diagnosis not present

## 2016-12-03 DIAGNOSIS — Z8742 Personal history of other diseases of the female genital tract: Secondary | ICD-10-CM | POA: Diagnosis not present

## 2016-12-03 DIAGNOSIS — N950 Postmenopausal bleeding: Secondary | ICD-10-CM | POA: Diagnosis not present

## 2016-12-05 DIAGNOSIS — Z6841 Body Mass Index (BMI) 40.0 and over, adult: Secondary | ICD-10-CM | POA: Diagnosis not present

## 2016-12-05 DIAGNOSIS — I10 Essential (primary) hypertension: Secondary | ICD-10-CM | POA: Diagnosis not present

## 2016-12-05 DIAGNOSIS — E669 Obesity, unspecified: Secondary | ICD-10-CM | POA: Diagnosis not present

## 2016-12-05 DIAGNOSIS — G4730 Sleep apnea, unspecified: Secondary | ICD-10-CM | POA: Diagnosis not present

## 2016-12-09 DIAGNOSIS — R600 Localized edema: Secondary | ICD-10-CM | POA: Diagnosis not present

## 2016-12-09 DIAGNOSIS — I872 Venous insufficiency (chronic) (peripheral): Secondary | ICD-10-CM | POA: Diagnosis not present

## 2016-12-31 DIAGNOSIS — G4730 Sleep apnea, unspecified: Secondary | ICD-10-CM | POA: Diagnosis not present

## 2017-02-09 DIAGNOSIS — G4733 Obstructive sleep apnea (adult) (pediatric): Secondary | ICD-10-CM | POA: Diagnosis not present

## 2017-02-19 DIAGNOSIS — G2581 Restless legs syndrome: Secondary | ICD-10-CM | POA: Diagnosis not present

## 2017-02-19 DIAGNOSIS — Z6841 Body Mass Index (BMI) 40.0 and over, adult: Secondary | ICD-10-CM | POA: Diagnosis not present

## 2017-02-19 DIAGNOSIS — G4733 Obstructive sleep apnea (adult) (pediatric): Secondary | ICD-10-CM | POA: Diagnosis not present

## 2017-02-19 DIAGNOSIS — E669 Obesity, unspecified: Secondary | ICD-10-CM | POA: Diagnosis not present

## 2017-02-19 DIAGNOSIS — Z23 Encounter for immunization: Secondary | ICD-10-CM | POA: Diagnosis not present

## 2017-03-26 DIAGNOSIS — G4733 Obstructive sleep apnea (adult) (pediatric): Secondary | ICD-10-CM | POA: Diagnosis not present

## 2017-04-22 DIAGNOSIS — G4733 Obstructive sleep apnea (adult) (pediatric): Secondary | ICD-10-CM | POA: Diagnosis not present

## 2017-04-22 DIAGNOSIS — I2510 Atherosclerotic heart disease of native coronary artery without angina pectoris: Secondary | ICD-10-CM | POA: Diagnosis not present

## 2017-04-22 DIAGNOSIS — N183 Chronic kidney disease, stage 3 (moderate): Secondary | ICD-10-CM | POA: Diagnosis not present

## 2017-04-22 DIAGNOSIS — R600 Localized edema: Secondary | ICD-10-CM | POA: Diagnosis not present

## 2017-04-22 DIAGNOSIS — I10 Essential (primary) hypertension: Secondary | ICD-10-CM | POA: Diagnosis not present

## 2017-05-07 DIAGNOSIS — M25562 Pain in left knee: Secondary | ICD-10-CM | POA: Diagnosis not present

## 2017-05-30 DIAGNOSIS — M1712 Unilateral primary osteoarthritis, left knee: Secondary | ICD-10-CM | POA: Diagnosis not present

## 2017-05-30 DIAGNOSIS — Z96651 Presence of right artificial knee joint: Secondary | ICD-10-CM | POA: Diagnosis not present

## 2017-05-30 DIAGNOSIS — M25562 Pain in left knee: Secondary | ICD-10-CM | POA: Diagnosis not present

## 2017-05-30 DIAGNOSIS — R269 Unspecified abnormalities of gait and mobility: Secondary | ICD-10-CM | POA: Diagnosis not present

## 2017-06-05 DIAGNOSIS — M1712 Unilateral primary osteoarthritis, left knee: Secondary | ICD-10-CM | POA: Diagnosis not present

## 2017-06-05 DIAGNOSIS — M25462 Effusion, left knee: Secondary | ICD-10-CM | POA: Diagnosis not present

## 2017-06-05 DIAGNOSIS — M25562 Pain in left knee: Secondary | ICD-10-CM | POA: Diagnosis not present

## 2017-06-12 DIAGNOSIS — M25562 Pain in left knee: Secondary | ICD-10-CM | POA: Diagnosis not present

## 2017-06-12 DIAGNOSIS — M1712 Unilateral primary osteoarthritis, left knee: Secondary | ICD-10-CM | POA: Diagnosis not present

## 2017-06-19 DIAGNOSIS — M1712 Unilateral primary osteoarthritis, left knee: Secondary | ICD-10-CM | POA: Diagnosis not present

## 2017-06-19 DIAGNOSIS — M25562 Pain in left knee: Secondary | ICD-10-CM | POA: Diagnosis not present

## 2017-06-26 DIAGNOSIS — M1712 Unilateral primary osteoarthritis, left knee: Secondary | ICD-10-CM | POA: Diagnosis not present

## 2017-06-26 DIAGNOSIS — M25562 Pain in left knee: Secondary | ICD-10-CM | POA: Diagnosis not present

## 2017-09-09 DIAGNOSIS — M109 Gout, unspecified: Secondary | ICD-10-CM | POA: Diagnosis not present

## 2017-09-09 DIAGNOSIS — J302 Other seasonal allergic rhinitis: Secondary | ICD-10-CM | POA: Diagnosis not present

## 2017-09-09 DIAGNOSIS — I10 Essential (primary) hypertension: Secondary | ICD-10-CM | POA: Diagnosis not present

## 2017-09-09 DIAGNOSIS — D649 Anemia, unspecified: Secondary | ICD-10-CM | POA: Diagnosis not present

## 2017-09-09 DIAGNOSIS — N183 Chronic kidney disease, stage 3 (moderate): Secondary | ICD-10-CM | POA: Diagnosis not present

## 2017-09-09 DIAGNOSIS — I2510 Atherosclerotic heart disease of native coronary artery without angina pectoris: Secondary | ICD-10-CM | POA: Diagnosis not present

## 2017-09-09 DIAGNOSIS — E559 Vitamin D deficiency, unspecified: Secondary | ICD-10-CM | POA: Diagnosis not present

## 2017-09-09 DIAGNOSIS — M170 Bilateral primary osteoarthritis of knee: Secondary | ICD-10-CM | POA: Diagnosis not present

## 2017-09-09 DIAGNOSIS — E782 Mixed hyperlipidemia: Secondary | ICD-10-CM | POA: Diagnosis not present

## 2017-09-09 DIAGNOSIS — K219 Gastro-esophageal reflux disease without esophagitis: Secondary | ICD-10-CM | POA: Diagnosis not present

## 2017-09-30 DIAGNOSIS — N183 Chronic kidney disease, stage 3 (moderate): Secondary | ICD-10-CM | POA: Diagnosis not present

## 2017-09-30 DIAGNOSIS — E785 Hyperlipidemia, unspecified: Secondary | ICD-10-CM | POA: Diagnosis not present

## 2017-09-30 DIAGNOSIS — N2581 Secondary hyperparathyroidism of renal origin: Secondary | ICD-10-CM | POA: Diagnosis not present

## 2017-09-30 DIAGNOSIS — M109 Gout, unspecified: Secondary | ICD-10-CM | POA: Diagnosis not present

## 2017-09-30 DIAGNOSIS — E1122 Type 2 diabetes mellitus with diabetic chronic kidney disease: Secondary | ICD-10-CM | POA: Diagnosis not present

## 2017-09-30 DIAGNOSIS — N184 Chronic kidney disease, stage 4 (severe): Secondary | ICD-10-CM | POA: Diagnosis not present

## 2017-09-30 DIAGNOSIS — I10 Essential (primary) hypertension: Secondary | ICD-10-CM | POA: Diagnosis not present

## 2017-09-30 DIAGNOSIS — D631 Anemia in chronic kidney disease: Secondary | ICD-10-CM | POA: Diagnosis not present

## 2017-09-30 DIAGNOSIS — E559 Vitamin D deficiency, unspecified: Secondary | ICD-10-CM | POA: Diagnosis not present

## 2017-09-30 DIAGNOSIS — R809 Proteinuria, unspecified: Secondary | ICD-10-CM | POA: Diagnosis not present

## 2017-09-30 DIAGNOSIS — I129 Hypertensive chronic kidney disease with stage 1 through stage 4 chronic kidney disease, or unspecified chronic kidney disease: Secondary | ICD-10-CM | POA: Diagnosis not present

## 2017-10-06 DIAGNOSIS — Z1231 Encounter for screening mammogram for malignant neoplasm of breast: Secondary | ICD-10-CM | POA: Diagnosis not present

## 2017-10-21 DIAGNOSIS — G4733 Obstructive sleep apnea (adult) (pediatric): Secondary | ICD-10-CM | POA: Diagnosis not present

## 2017-10-21 DIAGNOSIS — I2510 Atherosclerotic heart disease of native coronary artery without angina pectoris: Secondary | ICD-10-CM | POA: Diagnosis not present

## 2017-10-21 DIAGNOSIS — I10 Essential (primary) hypertension: Secondary | ICD-10-CM | POA: Diagnosis not present

## 2017-10-21 DIAGNOSIS — E782 Mixed hyperlipidemia: Secondary | ICD-10-CM | POA: Diagnosis not present

## 2017-10-21 DIAGNOSIS — R600 Localized edema: Secondary | ICD-10-CM | POA: Diagnosis not present

## 2018-01-07 DIAGNOSIS — M25562 Pain in left knee: Secondary | ICD-10-CM | POA: Diagnosis not present

## 2018-01-07 DIAGNOSIS — M1712 Unilateral primary osteoarthritis, left knee: Secondary | ICD-10-CM | POA: Diagnosis not present

## 2018-02-05 DIAGNOSIS — E785 Hyperlipidemia, unspecified: Secondary | ICD-10-CM | POA: Diagnosis not present

## 2018-02-05 DIAGNOSIS — G4730 Sleep apnea, unspecified: Secondary | ICD-10-CM | POA: Diagnosis not present

## 2018-02-05 DIAGNOSIS — J309 Allergic rhinitis, unspecified: Secondary | ICD-10-CM | POA: Diagnosis not present

## 2018-02-05 DIAGNOSIS — Z6841 Body Mass Index (BMI) 40.0 and over, adult: Secondary | ICD-10-CM | POA: Diagnosis not present

## 2018-02-05 DIAGNOSIS — I25119 Atherosclerotic heart disease of native coronary artery with unspecified angina pectoris: Secondary | ICD-10-CM | POA: Diagnosis not present

## 2018-02-05 DIAGNOSIS — I10 Essential (primary) hypertension: Secondary | ICD-10-CM | POA: Diagnosis not present

## 2018-02-05 DIAGNOSIS — E559 Vitamin D deficiency, unspecified: Secondary | ICD-10-CM | POA: Diagnosis not present

## 2018-02-05 DIAGNOSIS — E611 Iron deficiency: Secondary | ICD-10-CM | POA: Diagnosis not present

## 2018-02-05 DIAGNOSIS — G8929 Other chronic pain: Secondary | ICD-10-CM | POA: Diagnosis not present

## 2018-03-11 DIAGNOSIS — M25521 Pain in right elbow: Secondary | ICD-10-CM | POA: Diagnosis not present

## 2018-03-11 DIAGNOSIS — W19XXXA Unspecified fall, initial encounter: Secondary | ICD-10-CM | POA: Diagnosis not present

## 2018-03-19 DIAGNOSIS — K219 Gastro-esophageal reflux disease without esophagitis: Secondary | ICD-10-CM | POA: Diagnosis not present

## 2018-03-19 DIAGNOSIS — N183 Chronic kidney disease, stage 3 (moderate): Secondary | ICD-10-CM | POA: Diagnosis not present

## 2018-03-19 DIAGNOSIS — I10 Essential (primary) hypertension: Secondary | ICD-10-CM | POA: Diagnosis not present

## 2018-03-19 DIAGNOSIS — E559 Vitamin D deficiency, unspecified: Secondary | ICD-10-CM | POA: Diagnosis not present

## 2018-03-19 DIAGNOSIS — D649 Anemia, unspecified: Secondary | ICD-10-CM | POA: Diagnosis not present

## 2018-03-19 DIAGNOSIS — E782 Mixed hyperlipidemia: Secondary | ICD-10-CM | POA: Diagnosis not present

## 2018-03-19 DIAGNOSIS — J302 Other seasonal allergic rhinitis: Secondary | ICD-10-CM | POA: Diagnosis not present

## 2018-03-19 DIAGNOSIS — M170 Bilateral primary osteoarthritis of knee: Secondary | ICD-10-CM | POA: Diagnosis not present

## 2018-03-19 DIAGNOSIS — M109 Gout, unspecified: Secondary | ICD-10-CM | POA: Diagnosis not present

## 2018-03-19 DIAGNOSIS — I2510 Atherosclerotic heart disease of native coronary artery without angina pectoris: Secondary | ICD-10-CM | POA: Diagnosis not present

## 2018-04-23 ENCOUNTER — Encounter: Payer: Self-pay | Admitting: Obstetrics & Gynecology

## 2018-05-12 ENCOUNTER — Encounter: Payer: Self-pay | Admitting: Obstetrics & Gynecology

## 2018-05-19 DIAGNOSIS — E7800 Pure hypercholesterolemia, unspecified: Secondary | ICD-10-CM | POA: Diagnosis not present

## 2018-05-19 DIAGNOSIS — Z91013 Allergy to seafood: Secondary | ICD-10-CM | POA: Diagnosis not present

## 2018-05-19 DIAGNOSIS — I10 Essential (primary) hypertension: Secondary | ICD-10-CM | POA: Diagnosis not present

## 2018-05-19 DIAGNOSIS — Z79899 Other long term (current) drug therapy: Secondary | ICD-10-CM | POA: Diagnosis not present

## 2018-05-19 DIAGNOSIS — Z955 Presence of coronary angioplasty implant and graft: Secondary | ICD-10-CM | POA: Diagnosis not present

## 2018-05-19 DIAGNOSIS — Z7982 Long term (current) use of aspirin: Secondary | ICD-10-CM | POA: Diagnosis not present

## 2018-05-19 DIAGNOSIS — M79661 Pain in right lower leg: Secondary | ICD-10-CM | POA: Diagnosis not present

## 2018-05-19 DIAGNOSIS — M7989 Other specified soft tissue disorders: Secondary | ICD-10-CM | POA: Diagnosis not present

## 2018-05-28 DIAGNOSIS — M5430 Sciatica, unspecified side: Secondary | ICD-10-CM | POA: Diagnosis not present

## 2018-06-09 ENCOUNTER — Encounter: Payer: Self-pay | Admitting: Obstetrics & Gynecology

## 2018-06-09 ENCOUNTER — Ambulatory Visit: Payer: Medicare HMO | Admitting: Obstetrics & Gynecology

## 2018-06-09 ENCOUNTER — Encounter (INDEPENDENT_AMBULATORY_CARE_PROVIDER_SITE_OTHER): Payer: Self-pay

## 2018-06-09 VITALS — BP 109/62 | HR 65 | Ht 62.0 in | Wt 258.2 lb

## 2018-06-09 DIAGNOSIS — N950 Postmenopausal bleeding: Secondary | ICD-10-CM

## 2018-06-09 NOTE — Progress Notes (Signed)
Chief Complaint  Patient presents with  . Vaginal Bleeding    x 2 months      78 y.o. G6P6 No LMP recorded. Patient is postmenopausal. The current method of family planning is post menopausal status.  Outpatient Encounter Medications as of 06/09/2018  Medication Sig  . allopurinol (ZYLOPRIM) 300 MG tablet Take 300 mg by mouth daily.  Marland Kitchen aspirin EC 81 MG tablet Take 81 mg by mouth daily.  Marland Kitchen atorvastatin (LIPITOR) 40 MG tablet Take 40 mg by mouth at bedtime.  . clopidogrel (PLAVIX) 75 MG tablet Take 75 mg by mouth daily.  Marland Kitchen esomeprazole (NEXIUM) 40 MG capsule Take 40 mg by mouth as needed.  . felodipine (PLENDIL) 10 MG 24 hr tablet Take 10 mg by mouth daily.  . furosemide (LASIX) 20 MG tablet Take 20 mg by mouth daily.  . isosorbide dinitrate (ISORDIL) 20 MG tablet Take 20 mg by mouth every evening.  . metoprolol (LOPRESSOR) 50 MG tablet Take 50 mg by mouth 2 (two) times daily.  . nabumetone (RELAFEN) 750 MG tablet Take 750 mg by mouth 2 (two) times daily as needed.  Marland Kitchen oxyCODONE (OXY IR/ROXICODONE) 5 MG immediate release tablet Take 1-2 tablets (5-10 mg total) by mouth every 4 (four) hours as needed for breakthrough pain.  Marland Kitchen quinapril (ACCUPRIL) 40 MG tablet Take 40 mg by mouth daily.   . nitroGLYCERIN (NITROSTAT) 0.4 MG SL tablet Place 0.4 mg under the tongue every 5 (five) minutes as needed for chest pain. DO NOT EXCEED THREE DOSES , THEN CALL 911  . traMADol (ULTRAM) 50 MG tablet Take 50 mg by mouth 3 (three) times daily as needed.  . traZODone (DESYREL) 100 MG tablet Take 100 mg by mouth at bedtime.   No facility-administered encounter medications on file as of 06/09/2018.     Subjective Pt has had spotting the last 2 months Bight red not heavy 2 years ago had smilar episode and Dr Modena Nunnery evaluated Recently restarted No pain Evaluation according to patient was negative Past Medical History:  Diagnosis Date  . Arthritis   . GERD (gastroesophageal reflux disease)   .  Gout   . Hypertension     Past Surgical History:  Procedure Laterality Date  . CORONARY ANGIOPLASTY WITH STENT PLACEMENT    . CYST REMOVAL HAND Left    Left middle finger  . DILATION AND CURETTAGE OF UTERUS    . 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 VEIN LIGATION AND STRIPPING Bilateral     OB History    Gravida  6   Para  6   Term      Preterm      AB      Living  3     SAB      TAB      Ectopic      Multiple      Live Births              Allergies  Allergen Reactions  . Celebrex [Celecoxib] Rash    Social History   Socioeconomic History  . Marital status: Widowed    Spouse name: Not on file  . Number of children: 6  . Years of education: Not on file  . Highest education level: Not on file  Occupational History  . Not on file  Social Needs  .  Financial resource strain: Not on file  . Food insecurity:    Worry: Not on file    Inability: Not on file  . Transportation needs:    Medical: Not on file    Non-medical: Not on file  Tobacco Use  . Smoking status: Never Smoker  . Smokeless tobacco: Never Used  Substance and Sexual Activity  . Alcohol use: No  . Drug use: No  . Sexual activity: Not Currently    Birth control/protection: Post-menopausal  Lifestyle  . Physical activity:    Days per week: Not on file    Minutes per session: Not on file  . Stress: Not on file  Relationships  . Social connections:    Talks on phone: Not on file    Gets together: Not on file    Attends religious service: Not on file    Active member of club or organization: Not on file    Attends meetings of clubs or organizations: Not on file    Relationship status: Not on file  Other Topics Concern  . Not on file  Social History Narrative  . Not on file    Family History  Problem Relation Age of Onset  . Stroke Father     . Other Father        blood clots  . Other Mother     Medications:       Current Outpatient Medications:  .  allopurinol (ZYLOPRIM) 300 MG tablet, Take 300 mg by mouth daily., Disp: , Rfl:  .  aspirin EC 81 MG tablet, Take 81 mg by mouth daily., Disp: , Rfl:  .  atorvastatin (LIPITOR) 40 MG tablet, Take 40 mg by mouth at bedtime., Disp: , Rfl:  .  clopidogrel (PLAVIX) 75 MG tablet, Take 75 mg by mouth daily., Disp: , Rfl:  .  esomeprazole (NEXIUM) 40 MG capsule, Take 40 mg by mouth as needed., Disp: , Rfl:  .  felodipine (PLENDIL) 10 MG 24 hr tablet, Take 10 mg by mouth daily., Disp: , Rfl:  .  furosemide (LASIX) 20 MG tablet, Take 20 mg by mouth daily., Disp: , Rfl:  .  isosorbide dinitrate (ISORDIL) 20 MG tablet, Take 20 mg by mouth every evening., Disp: , Rfl:  .  metoprolol (LOPRESSOR) 50 MG tablet, Take 50 mg by mouth 2 (two) times daily., Disp: , Rfl:  .  nabumetone (RELAFEN) 750 MG tablet, Take 750 mg by mouth 2 (two) times daily as needed., Disp: , Rfl:  .  oxyCODONE (OXY IR/ROXICODONE) 5 MG immediate release tablet, Take 1-2 tablets (5-10 mg total) by mouth every 4 (four) hours as needed for breakthrough pain., Disp: 60 tablet, Rfl: 0 .  quinapril (ACCUPRIL) 40 MG tablet, Take 40 mg by mouth daily. , Disp: , Rfl:  .  nitroGLYCERIN (NITROSTAT) 0.4 MG SL tablet, Place 0.4 mg under the tongue every 5 (five) minutes as needed for chest pain. DO NOT EXCEED THREE DOSES , THEN CALL 911, Disp: , Rfl:  .  traMADol (ULTRAM) 50 MG tablet, Take 50 mg by mouth 3 (three) times daily as needed., Disp: , Rfl:  .  traZODone (DESYREL) 100 MG tablet, Take 100 mg by mouth at bedtime., Disp: , Rfl:   Objective Blood pressure 109/62, pulse 65, height 5\' 2"  (1.575 m), weight 258 lb 3.2 oz (117.1 kg).  Cuff vs cervical dimple, attempted an EMB without success Not 100% sure she has a cervix if so atrophic and stenotic  Pertinent ROS  No burning with urination, frequency or urgency No nausea, vomiting  or diarrhea Nor fever chills or other constitutional symptoms   Labs or studies none    Impression Diagnoses this Encounter::   ICD-10-CM   1. PMB (postmenopausal bleeding) N95.0 US PELVIS (TRANSABDOMINAL ONLY)    US PELVIS TRANSVANGINAL NON-OB (TV ONLY)    Established relevant diagnosis(es):   Plan/Recommendations: No orders of the defined types were placed in this encounter.   Labs or Scans Ordered: Orders Placed This Encounter  Procedures  . US PELVIS (TRANSABDOMINAL ONLY)  . US PELVIS TRANSVANGINAL NON-OB (TV ONLY)    Management:: >sonogram to evaluate presence of uterus and evaluate the endometrium if present  Follow up Return in about 2 weeks (around 06/23/2018) for GYN sono, Follow up, with Dr Elonda Husky.       All questions were answered.

## 2018-06-23 ENCOUNTER — Ambulatory Visit: Payer: Medicare HMO | Admitting: Obstetrics & Gynecology

## 2018-06-23 ENCOUNTER — Encounter: Payer: Self-pay | Admitting: Obstetrics & Gynecology

## 2018-06-23 ENCOUNTER — Ambulatory Visit (INDEPENDENT_AMBULATORY_CARE_PROVIDER_SITE_OTHER): Payer: Medicare HMO

## 2018-06-23 ENCOUNTER — Other Ambulatory Visit: Payer: Self-pay

## 2018-06-23 VITALS — BP 136/63 | HR 68 | Ht 68.0 in | Wt 258.0 lb

## 2018-06-23 DIAGNOSIS — N950 Postmenopausal bleeding: Secondary | ICD-10-CM

## 2018-06-23 DIAGNOSIS — R9389 Abnormal findings on diagnostic imaging of other specified body structures: Secondary | ICD-10-CM | POA: Diagnosis not present

## 2018-06-23 NOTE — Progress Notes (Signed)
. Follow up appointment for results  Chief Complaint  Patient presents with  . Follow-up    gyn u/s    Blood pressure 136/63, pulse 68, height 5\' 8"  (1.727 m), weight 258 lb (117 kg).    See sonogram report, thickened complex endometrium is present  MEDS ordered this encounter: No orders of the defined types were placed in this encounter.   Orders for this encounter: No orders of the defined types were placed in this encounter.   Impression: PMB (postmenopausal bleeding)  Thickened endometrium    Plan: >schedule hysteroscopy D&C for thickened endometrium and PMB, Could not do her EMB in the office  Follow Up: Return in about 3 weeks (around 07/14/2018) for Post Op, with Dr Elonda Husky.       Face to face time:  10 minutes  Greater than 50% of the visit time was spent in counseling and coordination of care with the patient.  The summary and outline of the counseling and care coordination is summarized in the note above.   All questions were answered.  Past Medical History:  Diagnosis Date  . Arthritis   . GERD (gastroesophageal reflux disease)   . Gout   . Hypertension     Past Surgical History:  Procedure Laterality Date  . CORONARY ANGIOPLASTY WITH STENT PLACEMENT    . CYST REMOVAL HAND Left    Left middle finger  . DILATION AND CURETTAGE OF UTERUS    . 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 VEIN LIGATION AND STRIPPING Bilateral     OB History    Gravida  6   Para  6   Term      Preterm      AB      Living  3     SAB      TAB      Ectopic      Multiple      Live Births              Allergies  Allergen Reactions  . Celebrex [Celecoxib] Rash    Social History   Socioeconomic History  . Marital status: Widowed    Spouse name: Not on file  . Number of children: 6  . Years of education: Not  on file  . Highest education level: Not on file  Occupational History  . Not on file  Social Needs  . Financial resource strain: Not on file  . Food insecurity:    Worry: Not on file    Inability: Not on file  . Transportation needs:    Medical: Not on file    Non-medical: Not on file  Tobacco Use  . Smoking status: Never Smoker  . Smokeless tobacco: Never Used  Substance and Sexual Activity  . Alcohol use: No  . Drug use: No  . Sexual activity: Not Currently    Birth control/protection: Post-menopausal  Lifestyle  . Physical activity:    Days per week: Not on file    Minutes per session: Not on file  . Stress: Not on file  Relationships  . Social connections:    Talks on phone: Not on file    Gets together: Not on file    Attends religious service: Not on file    Active member of club or organization: Not on file    Attends meetings of clubs or  organizations: Not on file    Relationship status: Not on file  Other Topics Concern  . Not on file  Social History Narrative  . Not on file    Family History  Problem Relation Age of Onset  . Stroke Father   . Other Father        blood clots  . Other Mother

## 2018-06-23 NOTE — Progress Notes (Signed)
PELVIC US TA/TV:heterogeneous anteverted uterus , anterior/fundal, right subserosal fibroid 3.8 x 4.2 x 3.3 cm,complex thickened endometrium with a stalk of color flow fundal,no endometrial masses visualized,EEC 12 mm,normal ovaries bilat,no pain during ultrasound,no free fluid

## 2018-06-26 DIAGNOSIS — Z888 Allergy status to other drugs, medicaments and biological substances status: Secondary | ICD-10-CM | POA: Diagnosis not present

## 2018-06-26 DIAGNOSIS — I10 Essential (primary) hypertension: Secondary | ICD-10-CM | POA: Diagnosis not present

## 2018-06-26 DIAGNOSIS — E7800 Pure hypercholesterolemia, unspecified: Secondary | ICD-10-CM | POA: Diagnosis not present

## 2018-06-26 DIAGNOSIS — R002 Palpitations: Secondary | ICD-10-CM | POA: Diagnosis not present

## 2018-06-26 DIAGNOSIS — Z955 Presence of coronary angioplasty implant and graft: Secondary | ICD-10-CM | POA: Diagnosis not present

## 2018-06-26 DIAGNOSIS — Z91013 Allergy to seafood: Secondary | ICD-10-CM | POA: Diagnosis not present

## 2018-06-28 ENCOUNTER — Encounter: Payer: Self-pay | Admitting: Obstetrics & Gynecology

## 2018-06-30 DIAGNOSIS — D631 Anemia in chronic kidney disease: Secondary | ICD-10-CM | POA: Diagnosis not present

## 2018-06-30 DIAGNOSIS — R809 Proteinuria, unspecified: Secondary | ICD-10-CM | POA: Diagnosis not present

## 2018-06-30 DIAGNOSIS — N183 Chronic kidney disease, stage 3 (moderate): Secondary | ICD-10-CM | POA: Diagnosis not present

## 2018-06-30 DIAGNOSIS — N2581 Secondary hyperparathyroidism of renal origin: Secondary | ICD-10-CM | POA: Diagnosis not present

## 2018-06-30 DIAGNOSIS — I129 Hypertensive chronic kidney disease with stage 1 through stage 4 chronic kidney disease, or unspecified chronic kidney disease: Secondary | ICD-10-CM | POA: Diagnosis not present

## 2018-06-30 DIAGNOSIS — M109 Gout, unspecified: Secondary | ICD-10-CM | POA: Diagnosis not present

## 2018-06-30 DIAGNOSIS — N179 Acute kidney failure, unspecified: Secondary | ICD-10-CM | POA: Diagnosis not present

## 2018-07-01 DIAGNOSIS — M109 Gout, unspecified: Secondary | ICD-10-CM | POA: Diagnosis not present

## 2018-07-01 DIAGNOSIS — R809 Proteinuria, unspecified: Secondary | ICD-10-CM | POA: Diagnosis not present

## 2018-07-01 DIAGNOSIS — I129 Hypertensive chronic kidney disease with stage 1 through stage 4 chronic kidney disease, or unspecified chronic kidney disease: Secondary | ICD-10-CM | POA: Diagnosis not present

## 2018-07-01 DIAGNOSIS — N183 Chronic kidney disease, stage 3 (moderate): Secondary | ICD-10-CM | POA: Diagnosis not present

## 2018-07-01 DIAGNOSIS — D631 Anemia in chronic kidney disease: Secondary | ICD-10-CM | POA: Diagnosis not present

## 2018-07-01 DIAGNOSIS — N179 Acute kidney failure, unspecified: Secondary | ICD-10-CM | POA: Diagnosis not present

## 2018-07-01 DIAGNOSIS — N2581 Secondary hyperparathyroidism of renal origin: Secondary | ICD-10-CM | POA: Diagnosis not present

## 2018-07-01 NOTE — Patient Instructions (Signed)
Cindy Roberts  07/01/2018     @PREFPERIOPPHARMACY @   Your procedure is scheduled on  07/08/2018  Report to [facility] at  615  A.M.  Call this number if you have problems the morning of surgery:  [phone]   Remember:  Do not eat or drink after midnight.                       Take these medicines the morning of surgery with A SIP OF WATER allopurinol, nexium, felodipine, metoprolol, isordil.    Do not wear jewelry, make-up or nail polish.  Do not wear lotions, powders, or perfumes, or deodorant.  Do not shave 48 hours prior to surgery.  Men may shave face and neck.  Do not bring valuables to the hospital.  [facility] is not responsible for any belongings or valuables.  Contacts, dentures or bridgework may not be worn into surgery.  Leave your suitcase in the car.  After surgery it may be brought to your room.  For patients admitted to the hospital, discharge time will be determined by your treatment team.  Patients discharged the day of surgery will not be allowed to drive home.   Name and phone number of your driver:   family Special instructions:  None  Please read over the following fact sheets that you were given. Anesthesia Post-op Instructions and Care and Recovery After Surgery       Dilation and Curettage or Vacuum Curettage  Dilation and curettage (D&C) and vacuum curettage are minor procedures. A D&C involves stretching (dilation) the cervix and scraping (curettage) the inside lining of the uterus (endometrium). During a D&C, tissue is gently scraped from the endometrium, starting from the top portion of the uterus down to the lowest part of the uterus (cervix). During a vacuum curettage, the lining and tissue in the uterus are removed with the use of gentle suction. Curettage may be performed to either diagnose or treat a problem. As a diagnostic procedure, curettage is performed to examine tissues from the uterus. A diagnostic  curettage may be done if you have:  Irregular bleeding in the uterus.  Bleeding with the development of clots.  Spotting between menstrual periods.  Prolonged menstrual periods or other abnormal bleeding.  Bleeding after menopause.  No menstrual period (amenorrhea).  A change in size and shape of the uterus.  Abnormal endometrial cells discovered during a Pap test. As a treatment procedure, curettage may be performed for the following reasons:  Removal of an IUD (intrauterine device).  Removal of retained placenta after giving birth.  Abortion.  Miscarriage.  Removal of endometrial polyps.  Removal of uncommon types of noncancerous lumps (fibroids). Tell a health care provider about:  Any allergies you have, including allergies to prescribed medicine or latex.  All medicines you are taking, including vitamins, herbs, eye drops, creams, and over-the-counter medicines. This is especially important if you take any blood-thinning medicine. Bring a list of all of your medicines to your appointment.  Any problems you or family members have had with anesthetic medicines.  Any blood disorders you have.  Any surgeries you have had.  Your medical history and any medical conditions you have.  Whether you are pregnant or may be pregnant.  Recent vaginal infections you have had.  Recent menstrual periods, bleeding problems you have had, and what form of birth control (contraception) you use. What  are the risks? Generally, this is a safe procedure. However, problems may occur, including:  Infection.  Heavy vaginal bleeding.  Allergic reactions to medicines.  Damage to the cervix or other structures or organs.  Development of scar tissue (adhesions) inside the uterus, which can cause abnormal amounts of menstrual bleeding. This may make it harder to get pregnant in the future.  A hole (perforation) or puncture in the uterine wall. This is rare. What happens before the  procedure? Staying hydrated Follow instructions from your health care provider about hydration, which may include:  Up to 2 hours before the procedure - you may continue to drink clear liquids, such as water, clear fruit juice, black coffee, and plain tea. Eating and drinking restrictions Follow instructions from your health care provider about eating and drinking, which may include:  8 hours before the procedure - stop eating heavy meals or foods such as meat, fried foods, or fatty foods.  6 hours before the procedure - stop eating light meals or foods, such as toast or cereal.  6 hours before the procedure - stop drinking milk or drinks that contain milk.  2 hours before the procedure - stop drinking clear liquids. If your health care provider told you to take your medicine(s) on the day of your procedure, take them with only a sip of water. Medicines  Ask your health care provider about: ? Changing or stopping your regular medicines. This is especially important if you are taking diabetes medicines or blood thinners. ? Taking medicines such as aspirin and ibuprofen. These medicines can thin your blood. Do not take these medicines before your procedure if your health care provider instructs you not to.  You may be given antibiotic medicine to help prevent infection. General instructions  For 24 hours before your procedure, do not: ? Douche. ? Use tampons. ? Use medicines, creams, or suppositories in the vagina. ? Have sexual intercourse.  You may be given a pregnancy test on the day of the procedure.  Plan to have someone take you home from the hospital or clinic.  You may have a blood or urine sample taken.  If you will be going home right after the procedure, plan to have someone with you for 24 hours. What happens during the procedure?  To reduce your risk of infection: ? Your health care team will wash or sanitize their hands. ? Your skin will be washed with soap.  An  IV tube will be inserted into one of your veins.  You will be given one of the following: ? A medicine that numbs the area in and around the cervix (local anesthetic). ? A medicine to make you fall asleep (general anesthetic).  You will lie down on your back, with your feet in foot rests (stirrups).  The size and position of your uterus will be checked.  A lubricated instrument (speculum or Sims retractor) will be inserted into the back side of your vagina. The speculum will be used to hold apart the walls of your vagina so your health care provider can see your cervix.  A tool (tenaculum) will be attached to the lip of the cervix to stabilize it.  Your cervix will be softened and dilated. This may be done by: ? Taking a medicine. ? Having tapered dilators or thin rods (laminaria) or gradual widening instruments (tapered dilators) inserted into your cervix.  A small, sharp, curved instrument (curette) will be used to scrape a small amount of tissue or cells  from the endometrium or cervical canal. In some cases, gentle suction is applied with the curette. The curette will then be removed. The cells will be taken to a lab for testing. The procedure may vary among health care providers and hospitals. What happens after the procedure?  You may have mild cramping, backache, pain, and light bleeding or spotting. You may pass small blood clots from your vagina.  You may have to wear compression stockings. These stockings help to prevent blood clots and reduce swelling in your legs.  Your blood pressure, heart rate, breathing rate, and blood oxygen level will be monitored until the medicines you were given have worn off. Summary  Dilation and curettage (D&C) involves stretching (dilation) the cervix and scraping (curettage) the inside lining of the uterus (endometrium).  After the procedure, you may have mild cramping, backache, pain, and light bleeding or spotting. You may pass small blood  clots from your vagina.  Plan to have someone take you home from the hospital or clinic. This information is not intended to replace advice given to you by your health care provider. Make sure you discuss any questions you have with your health care provider. Document Released: 05/20/2005 Document Revised: 02/04/2016 Document Reviewed: 02/04/2016 Elsevier Interactive Patient Education  2019 [employer]  Dilation and Curettage or Vacuum Curettage, Care After These instructions give you information about caring for yourself after your procedure. Your doctor may also give you more specific instructions. Call your doctor if you have any problems or questions after your procedure. Follow these instructions at home: Activity  Do not drive or use heavy machinery while taking prescription pain medicine.  For 24 hours after your procedure, avoid driving.  Take short walks often, followed by rest periods. Ask your doctor what activities are safe for you. After one or two days, you may be able to return to your normal activities.  Do not lift anything that is heavier than 10 lb (4.5 kg) until your doctor approves.  For at least 2 weeks, or as long as told by your doctor: ? Do not douche. ? Do not use tampons. ? Do not have sex. General instructions   Take over-the-counter and prescription medicines only as told by your doctor. This is very important if you take blood thinning medicine.  Do not take baths, swim, or use a hot tub until your doctor approves. Take showers instead of baths.  Wear compression stockings as told by your doctor.  It is up to you to get the results of your procedure. Ask your doctor when your results will be ready.  Keep all follow-up visits as told by your doctor. This is important. Contact a doctor if:  You have very bad cramps that get worse or do not get better with medicine.  You have very bad pain in your belly (abdomen).  You cannot drink fluids  without throwing up (vomiting).  You get pain in a different part of the area between your belly and thighs (pelvis).  You have bad-smelling discharge from your vagina.  You have a rash. Get help right away if:  You are bleeding a lot from your vagina. A lot of bleeding means soaking more than one sanitary pad in an hour, for 2 hours in a row.  You have clumps of blood (blood clots) coming from your vagina.  You have a fever or chills.  Your belly feels very tender or hard.  You have chest pain.  You have trouble breathing.  You cough up blood.  You feel dizzy.  You feel light-headed.  You pass out (faint).  You have pain in your neck or shoulder area. Summary  Take short walks often, followed by rest periods. Ask your doctor what activities are safe for you. After one or two days, you may be able to return to your normal activities.  Do not lift anything that is heavier than 10 lb (4.5 kg) until your doctor approves.  Do not take baths, swim, or use a hot tub until your doctor approves. Take showers instead of baths.  Contact your doctor if you have any symptoms of infection, like bad-smelling discharge from your vagina. This information is not intended to replace advice given to you by your health care provider. Make sure you discuss any questions you have with your health care provider. Document Released: 02/27/2008 Document Revised: 02/05/2016 Document Reviewed: 02/05/2016 Elsevier Interactive Patient Education  2019 [employer].  Hysteroscopy Hysteroscopy is a procedure that is used to examine the inside of a woman's womb (uterus). This may be done for various reasons, including:  To look for lumps (tumors) and other growths in the uterus.  To evaluate abnormal bleeding, fibroid tumors, polyps, scar tissue (adhesions), or cancer of the uterus.  To determine the cause of an inability to get pregnant (infertility) or repeated losses of pregnancies  (miscarriages).  To find a lost IUD (intrauterine device).  To perform a procedure that permanently prevents pregnancy (sterilization). During this procedure, a thin, flexible tube with a small light and camera (hysteroscope) is used to examine the uterus. The camera sends images to a monitor in the room so that your health care provider can view the inside of your uterus. A hysteroscopy should be done right after a menstrual period to make sure that you are not pregnant. Tell a health care provider about:  Any allergies you have.  All medicines you are taking, including vitamins, herbs, eye drops, creams, and over-the-counter medicines.  Any problems you or family members have had with the use of anesthetic medicines.  Any blood disorders you have.  Any surgeries you have had.  Any medical conditions you have.  Whether you are pregnant or may be pregnant. What are the risks? Generally, this is a safe procedure. However, problems may occur, including:  Excessive bleeding.  Infection.  Damage to the uterus or other structures or organs.  Allergic reaction to medicines or fluids that are used in the procedure. What happens before the procedure? Staying hydrated Follow instructions from your health care provider about hydration, which may include:  Up to 2 hours before the procedure - you may continue to drink clear liquids, such as water, clear fruit juice, black coffee, and plain tea. Eating and drinking restrictions Follow instructions from your health care provider about eating and drinking, which may include:  8 hours before the procedure - stop eating solid foods and drink clear liquids only  2 hours before the procedure - stop drinking clear liquids. General instructions  Ask your health care provider about: ? Changing or stopping your normal medicines. This is important if you take diabetes medicines or blood thinners. ? Taking medicines such as aspirin and  ibuprofen. These medicines can thin your blood and cause bleeding. Do not take these medicines for 1 week before your procedure, or as told by your health care provider.  Do not use any products that contain nicotine or tobacco for 2 weeks before the procedure. This includes cigarettes and  e-cigarettes. If you need help quitting, ask your health care provider.  Medicine may be placed in your cervix the day before the procedure. This medicine causes the cervix to have a larger opening (dilate). The larger opening makes it easier for the hysteroscope to be inserted into the uterus during the procedure.  Plan to have someone with you for the first 24-48 hours after the procedure, especially if you are given a medicine to make you fall asleep (general anesthetic).  Plan to have someone take you home from the hospital or clinic. What happens during the procedure?  To lower your risk of infection: ? Your health care team will wash or sanitize their hands. ? Your skin will be washed with soap. ? Hair may be removed from the surgical area.  An IV tube will be inserted into one of your veins.  You may be given one or more of the following: ? A medicine to help you relax (sedative). ? A medicine that numbs the area around the cervix (local anesthetic). ? A medicine to make you fall asleep (general anesthetic).  A hysteroscope will be inserted through your vagina and into your uterus.  Air or fluid will be used to enlarge your uterus, enabling your health care provider to see your uterus better. The amount of fluid used will be carefully checked throughout the procedure.  In some cases, tissue may be gently scraped from inside the uterus and sent to a lab for testing (biopsy). The procedure may vary among health care providers and hospitals. What happens after the procedure?  Your blood pressure, heart rate, breathing rate, and blood oxygen level will be monitored until the medicines you were  given have worn off.  You may have some cramping. You may be given medicines for this.  You may have bleeding, which varies from light spotting to menstrual-like bleeding. This is normal.  If you had a biopsy done, it is your responsibility to get the results of your procedure. Ask your health care provider, or the department performing the procedure, when your results will be ready. Summary  Hysteroscopy is a procedure that is used to examine the inside of a woman's womb (uterus).  After the procedure, you may have bleeding, which varies from light spotting to menstrual-like bleeding. This is normal. You may also have cramping.  Plan to have someone take you home from the hospital or clinic. This information is not intended to replace advice given to you by your health care provider. Make sure you discuss any questions you have with your health care provider. Document Released: 08/26/2000 Document Revised: 06/18/2016 Document Reviewed: 06/18/2016 Elsevier Interactive Patient Education  2019 [place].  Hysteroscopy, Care After This sheet gives you information about how to care for yourself after your procedure. Your health care provider may also give you more specific instructions. If you have problems or questions, contact your health care provider. What can I expect after the procedure? After the procedure, it is common to have:  Cramping.  Bleeding. This can vary from light spotting to menstrual-like bleeding. Follow these instructions at home: Activity  Rest for 1-2 days after the procedure.  Do not douche, use tampons, or have sex for 2 weeks after the procedure, or until your health care provider approves.  Do not drive for 24 hours after the procedure, or for as long as told by your health care provider.  Do not drive, use heavy machinery, or drink alcohol while taking prescription pain medicines.  Medicines   Take over-the-counter and prescription medicines only as  told by your health care provider.  Do not take aspirin during recovery. It can increase the risk of bleeding. General instructions  Do not take baths, swim, or use a hot tub until your health care provider approves. Take showers instead of baths for 2 weeks, or for as long as told by your health care provider.  To prevent or treat constipation while you are taking prescription pain medicine, your health care provider may recommend that you: ? Drink enough fluid to keep your urine clear or pale yellow. ? Take over-the-counter or prescription medicines. ? Eat foods that are high in fiber, such as fresh fruits and vegetables, whole grains, and beans. ? Limit foods that are high in fat and processed sugars, such as fried and sweet foods.  Keep all follow-up visits as told by your health care provider. This is important. Contact a health care provider if:  You feel dizzy or lightheaded.  You feel nauseous.  You have abnormal vaginal discharge.  You have a rash.  You have pain that does not get better with medicine.  You have chills. Get help right away if:  You have bleeding that is heavier than a normal menstrual period.  You have a fever.  You have pain or cramps that get worse.  You develop new abdominal pain.  You faint.  You have pain in your shoulders.  You have shortness of breath. Summary  After the procedure, you may have cramping and some vaginal bleeding.  Do not douche, use tampons, or have sex for 2 weeks after the procedure, or until your health care provider approves.  Do not take baths, swim, or use a hot tub until your health care provider approves. Take showers instead of baths for 2 weeks, or for as long as told by your health care provider.  Report any unusual symptoms to your health care provider.  Keep all follow-up visits as told by your health care provider. This is important. This information is not intended to replace advice given to you by  your health care provider. Make sure you discuss any questions you have with your health care provider. Document Released: 03/10/2013 Document Revised: 06/18/2016 Document Reviewed: 06/18/2016 Elsevier Interactive Patient Education  2019 [place] Anesthesia, Adult General anesthesia is the use of medicines to make a person "go to sleep" (unconscious) for a medical procedure. General anesthesia must be used for certain procedures, and is often recommended for procedures that:  Last a long time.  Require you to be still or in an unusual position.  Are major and can cause blood loss. The medicines used for general anesthesia are called general anesthetics. As well as making you unconscious for a certain amount of time, these medicines:  Prevent pain.  Control your blood pressure.  Relax your muscles. Tell a health care provider about:  Any allergies you have.  All medicines you are taking, including vitamins, herbs, eye drops, creams, and over-the-counter medicines.  Any problems you or family members have had with anesthetic medicines.  Types of anesthetics you have had in the past.  Any blood disorders you have.  Any surgeries you have had.  Any medical conditions you have.  Any recent upper respiratory, chest, or ear infections.  Any history of: ? Heart or lung conditions, such as heart failure, sleep apnea, asthma, or chronic obstructive pulmonary disease (COPD). ? Armed forces logistics/support/administrative officer. ? Depression or anxiety.  Any  tobacco or drug use, including marijuana or alcohol use.  Whether you are pregnant or may be pregnant. What are the risks? Generally, this is a safe procedure. However, problems may occur, including:  Allergic reaction.  Lung and heart problems.  Inhaling food or liquid from the stomach into the lungs (aspiration).  Nerve injury.  Dental injury.  Air in the bloodstream, which can lead to stroke.  Extreme agitation or confusion  (delirium) when you wake up from the anesthetic.  Waking up during your procedure and being unable to move. This is rare. These problems are more likely to develop if you are having a major surgery or if you have an advanced or serious medical condition. You can prevent some of these complications by answering all of your health care provider's questions thoroughly and by following all instructions before your procedure. General anesthesia can cause side effects, including:  Nausea or vomiting.  A sore throat from the breathing tube.  Hoarseness.  Wheezing or coughing.  Shaking chills.  Tiredness.  Body aches.  Anxiety.  Sleepiness or drowsiness.  Confusion or agitation. What happens before the procedure? Staying hydrated Follow instructions from your health care provider about hydration, which may include:  Up to 2 hours before the procedure - you may continue to drink clear liquids, such as water, clear fruit juice, black coffee, and plain tea.  Eating and drinking restrictions Follow instructions from your health care provider about eating and drinking, which may include:  8 hours before the procedure - stop eating heavy meals or foods such as meat, fried foods, or fatty foods.  6 hours before the procedure - stop eating light meals or foods, such as toast or cereal.  6 hours before the procedure - stop drinking milk or drinks that contain milk.  2 hours before the procedure - stop drinking clear liquids. Medicines Ask your health care provider about:  Changing or stopping your regular medicines. This is especially important if you are taking diabetes medicines or blood thinners.  Taking medicines such as aspirin and ibuprofen. These medicines can thin your blood. Do not take these medicines unless your health care provider tells you to take them.  Taking over-the-counter medicines, vitamins, herbs, and supplements. Do not take these during the week before your  procedure unless your health care provider approves them. General instructions  Starting 3-6 weeks before the procedure, do not use any products that contain nicotine or tobacco, such as cigarettes and e-cigarettes. If you need help quitting, ask your health care provider.  If you brush your teeth on the morning of the procedure, make sure to spit out all of the toothpaste.  Tell your health care provider if you become ill or develop a cold, cough, or fever.  If instructed by your health care provider, bring your sleep apnea device with you on the day of your surgery (if applicable).  Ask your health care provider if you will be going home the same day, the following day, or after a longer hospital stay. ? Plan to have someone take you home from the hospital or clinic. ? Plan to have a responsible adult care for you for at least 24 hours after you leave the hospital or clinic. This is important. What happens during the procedure?   You will be given anesthetics through both of the following: ? A mask placed over your nose and mouth. ? An IV in one of your veins.  You may receive a medicine to help  you relax (sedative).  After you are unconscious, a breathing tube may be inserted down your throat to help you breathe. This will be removed before you wake up.  An anesthesia specialist will stay with you throughout your procedure. He or she will: ? Keep you comfortable and safe by continuing to give you medicines and adjusting the amount of medicine that you get. ? Monitor your blood pressure, pulse, and oxygen levels to make sure that the anesthetics do not cause any problems. The procedure may vary among health care providers and hospitals. What happens after the procedure?  Your blood pressure, temperature, heart rate, breathing rate, and blood oxygen level will be monitored until the medicines you were given have worn off.  You will wake up in a recovery area. You may wake up  slowly.  If you feel anxious or agitated, you may be given medicine to help you calm down.  If you will be going home the same day, your health care provider may check to make sure you can walk, drink, and urinate.  Your health care provider will treat any pain or side effects you have before you go home.  Do not drive for 24 hours if you were given a sedative. Summary  General anesthesia is used to keep you still and prevent pain during a procedure.  It is important to tell your health care provider about your medical history and any surgeries you have had, and previous experience with anesthesia.  Follow your health care provider's instructions about when to stop eating, drinking, or taking certain medicines before your procedure.  Plan to have someone take you home from the hospital or clinic. This information is not intended to replace advice given to you by your health care provider. Make sure you discuss any questions you have with your health care provider. Document Released: 08/27/2007 Document Revised: 10/07/2017 Document Reviewed: 01/03/2017 Elsevier Interactive Patient Education  2019 [employer] [facility], Adult, Care After This sheet gives you information about how to care for yourself after your procedure. Your health care provider may also give you more specific instructions. If you have problems or questions, contact your health care provider. What can I expect after the procedure? After the procedure, the following side effects are common:  Pain or discomfort at the IV site.  Nausea.  Vomiting.  Sore throat.  Trouble concentrating.  Feeling cold or chills.  Weak or tired.  Sleepiness and fatigue.  Soreness and body aches. These side effects can affect parts of the body that were not involved in surgery. Follow these instructions at home:  For at least 24 hours after the procedure:  Have a responsible adult stay with you. It is important  to have someone help care for you until you are awake and alert.  Rest as needed.  Do not: ? Participate in activities in which you could fall or become injured. ? Drive. ? Use heavy machinery. ? Drink alcohol. ? Take sleeping pills or medicines that cause drowsiness. ? Make important decisions or sign legal documents. ? Take care of children on your own. Eating and drinking  Follow any instructions from your health care provider about eating or drinking restrictions.  When you feel hungry, start by eating small amounts of foods that are soft and easy to digest (bland), such as toast. Gradually return to your regular diet.  Drink enough fluid to keep your urine pale yellow.  If you vomit, rehydrate by drinking water, juice,  or clear broth. General instructions  If you have sleep apnea, surgery and certain medicines can increase your risk for breathing problems. Follow instructions from your health care provider about wearing your sleep device: ? Anytime you are sleeping, including during daytime naps. ? While taking prescription pain medicines, sleeping medicines, or medicines that make you drowsy.  Return to your normal activities as told by your health care provider. Ask your health care provider what activities are safe for you.  Take over-the-counter and prescription medicines only as told by your health care provider.  If you smoke, do not smoke without supervision.  Keep all follow-up visits as told by your health care provider. This is important. Contact a health care provider if:  You have nausea or vomiting that does not get better with medicine.  You cannot eat or drink without vomiting.  You have pain that does not get better with medicine.  You are unable to pass urine.  You develop a skin rash.  You have a fever.  You have redness around your IV site that gets worse. Get help right away if:  You have difficulty breathing.  You have chest pain.  You  have blood in your urine or stool, or you vomit blood. Summary  After the procedure, it is common to have a sore throat or nausea. It is also common to feel tired.  Have a responsible adult stay with you for the first 24 hours after general anesthesia. It is important to have someone help care for you until you are awake and alert.  When you feel hungry, start by eating small amounts of foods that are soft and easy to digest (bland), such as toast. Gradually return to your regular diet.  Drink enough fluid to keep your urine pale yellow.  Return to your normal activities as told by your health care provider. Ask your health care provider what activities are safe for you. This information is not intended to replace advice given to you by your health care provider. Make sure you discuss any questions you have with your health care provider. Document Released: 08/26/2000 Document Revised: 01/03/2017 Document Reviewed: 01/03/2017 Elsevier Interactive Patient Education  2019 [employer].

## 2018-07-03 ENCOUNTER — Other Ambulatory Visit: Payer: Self-pay | Admitting: Obstetrics & Gynecology

## 2018-07-03 ENCOUNTER — Encounter (HOSPITAL_COMMUNITY): Payer: Self-pay

## 2018-07-03 ENCOUNTER — Other Ambulatory Visit: Payer: Self-pay

## 2018-07-03 ENCOUNTER — Encounter (HOSPITAL_COMMUNITY)
Admission: RE | Admit: 2018-07-03 | Discharge: 2018-07-03 | Disposition: A | Discharge status: Home / Self Care | Payer: Medicare HMO | Source: Ambulatory Visit | Attending: Obstetrics & Gynecology | Admitting: Obstetrics & Gynecology

## 2018-07-03 DIAGNOSIS — Z01818 Encounter for other preprocedural examination: Secondary | ICD-10-CM | POA: Insufficient documentation

## 2018-07-03 DIAGNOSIS — I10 Essential (primary) hypertension: Secondary | ICD-10-CM | POA: Diagnosis not present

## 2018-07-03 HISTORY — DX: Sleep apnea, unspecified: G47.30

## 2018-07-03 LAB — COMPREHENSIVE METABOLIC PANEL
ALBUMIN: 3.8 g/dL (ref 3.5–5.0)
ALT: 18 U/L (ref 0–44)
ANION GAP: 10 (ref 5–15)
AST: 22 U/L (ref 15–41)
Alkaline Phosphatase: 89 U/L (ref 38–126)
BILIRUBIN TOTAL: 0.4 mg/dL (ref 0.3–1.2)
BUN: 24 mg/dL — ABNORMAL HIGH (ref 8–23)
CO2: 24 mmol/L (ref 22–32)
Calcium: 9 mg/dL (ref 8.9–10.3)
Chloride: 106 mmol/L (ref 98–111)
Creatinine, Ser: 1.5 mg/dL — ABNORMAL HIGH (ref 0.44–1.00)
GFR calc non Af Amer: 34 mL/min — ABNORMAL LOW (ref >60)
GFR, EST AFRICAN AMERICAN: 39 mL/min — AB (ref >60)
GLUCOSE: 130 mg/dL — AB (ref 70–99)
POTASSIUM: 3.6 mmol/L (ref 3.5–5.1)
SODIUM: 140 mmol/L (ref 135–145)
TOTAL PROTEIN: 7.1 g/dL (ref 6.5–8.1)

## 2018-07-03 LAB — CBC
HCT: 37.7 % (ref 36.0–46.0)
Hemoglobin: 11.3 g/dL — ABNORMAL LOW (ref 12.0–15.0)
MCH: 28.5 pg (ref 26.0–34.0)
MCHC: 30 g/dL (ref 30.0–36.0)
MCV: 95.2 fL (ref 80.0–100.0)
Platelets: 200 10*3/uL (ref 150–400)
RBC: 3.96 MIL/uL (ref 3.87–5.11)
RDW: 16 % — AB (ref 11.5–15.5)
WBC: 6.4 10*3/uL (ref 4.0–10.5)
nRBC: 0 % (ref 0.0–0.2)

## 2018-07-07 NOTE — H&P (Signed)
Preoperative History and Physical  Cindy Roberts is a 78 y.o. G6P6 with No LMP recorded. Patient is postmenopausal. admitted for a hysteroscopy uterine curettage for evaluation of a complex thickened endometrium and postmenopausal bleeding.    PMH:    Past Medical History:  Diagnosis Date  . Arthritis   . GERD (gastroesophageal reflux disease)   . Gout   . Hypertension   . Sleep apnea    can't sleep with CPAP    PSH:     Past Surgical History:  Procedure Laterality Date  . CORONARY ANGIOPLASTY WITH STENT PLACEMENT    . CYST REMOVAL HAND Left    Left middle finger  . DILATION AND CURETTAGE OF UTERUS    . 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 VEIN LIGATION AND STRIPPING Bilateral     POb/GynH:      OB History    Gravida  6   Para  6   Term      Preterm      AB      Living  3     SAB      TAB      Ectopic      Multiple      Live Births              SH:   Social History   Tobacco Use  . Smoking status: Never Smoker  . Smokeless tobacco: Never Used  Substance Use Topics  . Alcohol use: No  . Drug use: No    FH:    Family History  Problem Relation Age of Onset  . Stroke Father   . Other Father        blood clots  . Other Mother      Allergies:  Allergies  Allergen Reactions  . Celebrex [Celecoxib] Rash    Medications:      No current facility-administered medications for this encounter.   Current Outpatient Medications:  .  allopurinol (ZYLOPRIM) 300 MG tablet, Take 300 mg by mouth daily., Disp: , Rfl:  .  aspirin EC 81 MG tablet, Take 81 mg by mouth daily., Disp: , Rfl:  .  atorvastatin (LIPITOR) 40 MG tablet, Take 40 mg by mouth at bedtime., Disp: , Rfl:  .  esomeprazole (NEXIUM) 40 MG capsule, Take 40 mg by mouth daily as needed (heartburn). , Disp: , Rfl:  .  felodipine (PLENDIL) 10 MG 24 hr  tablet, Take 10 mg by mouth daily., Disp: , Rfl:  .  ferrous sulfate 325 (65 FE) MG tablet, Take 325 mg by mouth daily with breakfast., Disp: , Rfl:  .  furosemide (LASIX) 20 MG tablet, Take 20 mg by mouth daily as needed for fluid or edema. , Disp: , Rfl:  .  isosorbide dinitrate (ISORDIL) 20 MG tablet, Take 20 mg by mouth every evening., Disp: , Rfl:  .  metoprolol succinate (TOPROL-XL) 50 MG 24 hr tablet, Take 50 mg by mouth daily. Take with or immediately following a meal., Disp: , Rfl:  .  nitroGLYCERIN (NITROSTAT) 0.4 MG SL tablet, Place 0.4 mg under the tongue every 5 (five) minutes as needed for chest pain. DO NOT EXCEED THREE DOSES , THEN CALL 911, Disp: , Rfl:  .  quinapril (ACCUPRIL) 40 MG tablet, Take 40 mg by mouth daily. , Disp: , Rfl:   Review of Systems:  Review of Systems  Constitutional: Negative for fever, chills, weight loss, malaise/fatigue and diaphoresis.  HENT: Negative for hearing loss, ear pain, nosebleeds, congestion, sore throat, neck pain, tinnitus and ear discharge.   Eyes: Negative for blurred vision, double vision, photophobia, pain, discharge and redness.  Respiratory: Negative for cough, hemoptysis, sputum production, shortness of breath, wheezing and stridor.   Cardiovascular: Negative for chest pain, palpitations, orthopnea, claudication, leg swelling and PND.  Gastrointestinal: Positive for abdominal pain. Negative for heartburn, nausea, vomiting, diarrhea, constipation, blood in stool and melena.  Genitourinary: Negative for dysuria, urgency, frequency, hematuria and flank pain.  Musculoskeletal: Negative for myalgias, back pain, joint pain and falls.  Skin: Negative for itching and rash.  Neurological: Negative for dizziness, tingling, tremors, sensory change, speech change, focal weakness, seizures, loss of consciousness, weakness and headaches.  Endo/Heme/Allergies: Negative for environmental allergies and polydipsia. Does not bruise/bleed easily.   Psychiatric/Behavioral: Negative for depression, suicidal ideas, hallucinations, memory loss and substance abuse. The patient is not nervous/anxious and does not have insomnia.      PHYSICAL EXAM:  There were no vitals taken for this visit.    Vitals reviewed. Constitutional: She is oriented to person, place, and time. She appears well-developed and well-nourished.  HENT:  Head: Normocephalic and atraumatic.  Right Ear: External ear normal.  Left Ear: External ear normal.  Nose: Nose normal.  Mouth/Throat: Oropharynx is clear and moist.  Eyes: Conjunctivae and EOM are normal. Pupils are equal, round, and reactive to light. Right eye exhibits no discharge. Left eye exhibits no discharge. No scleral icterus.  Neck: Normal range of motion. Neck supple. No tracheal deviation present. No thyromegaly present.  Cardiovascular: Normal rate, regular rhythm, normal heart sounds and intact distal pulses.  Exam reveals no gallop and no friction rub.   No murmur heard. Respiratory: Effort normal and breath sounds normal. No respiratory distress. She has no wheezes. She has no rales. She exhibits no tenderness.  GI: Soft. Bowel sounds are normal. She exhibits no distension and no mass. There is tenderness. There is no rebound and no guarding.  Genitourinary:       Vulva is normal without lesions Vagina is pink moist without discharge Cervix is stenotic Uterus is normal size, contour, position, consistency, mobility, non-tender Adnexa is negative with normal sized ovaries by sonogram  Musculoskeletal: Normal range of motion. She exhibits no edema and no tenderness.  Neurological: She is alert and oriented to person, place, and time. She has normal reflexes. She displays normal reflexes. No cranial nerve deficit. She exhibits normal muscle tone. Coordination normal.  Skin: Skin is warm and dry. No rash noted. No erythema. No pallor.  Psychiatric: She has a normal mood and affect. Her behavior is  normal. Judgment and thought content normal.    Labs: Results for orders placed or performed during the hospital encounter of 07/03/18 (from the past 336 hour(s))  CBC   Collection Time: 07/03/18  8:08 AM  Result Value Ref Range   WBC 6.4 4.0 - 10.5 K/uL   RBC 3.96 3.87 - 5.11 MIL/uL   Hemoglobin 11.3 (L) 12.0 - 15.0 g/dL   HCT 37.7 36.0 - 46.0 %   MCV 95.2 80.0 - 100.0 fL   MCH 28.5 26.0 - 34.0 pg   MCHC 30.0 30.0 - 36.0 g/dL   RDW 16.0 (H) 11.5 - 15.5 %   Platelets 200 150 - 400 K/uL   nRBC 0.0 0.0 - 0.2 %  Comprehensive metabolic panel   Collection  Time: 07/03/18  8:08 AM  Result Value Ref Range   Sodium 140 135 - 145 mmol/L   Potassium 3.6 3.5 - 5.1 mmol/L   Chloride 106 98 - 111 mmol/L   CO2 24 22 - 32 mmol/L   Glucose, Bld 130 (H) 70 - 99 mg/dL   BUN 24 (H) 8 - 23 mg/dL   Creatinine, Ser 1.50 (H) 0.44 - 1.00 mg/dL   Calcium 9.0 8.9 - 10.3 mg/dL   Total Protein 7.1 6.5 - 8.1 g/dL   Albumin 3.8 3.5 - 5.0 g/dL   AST 22 15 - 41 U/L   ALT 18 0 - 44 U/L   Alkaline Phosphatase 89 38 - 126 U/L   Total Bilirubin 0.4 0.3 - 1.2 mg/dL   GFR calc non Af Amer 34 (L) >60 mL/min   GFR calc Af Amer 39 (L) >60 mL/min   Anion gap 10 5 - 15    EKG: Orders placed or performed during the hospital encounter of 07/03/18  . EKG 12-Lead  . EKG 12-Lead    Imaging Studies: No results found.    Assessment: Post menopausal bleeding with complex thickened endometrium Cervical stenosis, unable to dilate the cervix and perform EMB in the office  Plan: hysteroscopy uterine curettage for evaluation and stenotic cervix  Florian Buff 07/07/2018 4:51 PM

## 2018-07-08 ENCOUNTER — Encounter (HOSPITAL_COMMUNITY)
Admission: RE | Disposition: A | Discharge status: Home / Self Care | Payer: Self-pay | Source: Ambulatory Visit | Attending: Obstetrics & Gynecology

## 2018-07-08 ENCOUNTER — Encounter (HOSPITAL_COMMUNITY): Payer: Self-pay | Admitting: *Deleted

## 2018-07-08 ENCOUNTER — Ambulatory Visit (HOSPITAL_COMMUNITY): Payer: Medicare HMO | Admitting: Anesthesiology

## 2018-07-08 ENCOUNTER — Ambulatory Visit (HOSPITAL_COMMUNITY)
Admission: RE | Admit: 2018-07-08 | Discharge: 2018-07-08 | Disposition: A | Discharge status: Home / Self Care | Payer: Medicare HMO | Source: Ambulatory Visit | Attending: Obstetrics & Gynecology | Admitting: Obstetrics & Gynecology

## 2018-07-08 DIAGNOSIS — Z79899 Other long term (current) drug therapy: Secondary | ICD-10-CM | POA: Insufficient documentation

## 2018-07-08 DIAGNOSIS — K219 Gastro-esophageal reflux disease without esophagitis: Secondary | ICD-10-CM | POA: Insufficient documentation

## 2018-07-08 DIAGNOSIS — N840 Polyp of corpus uteri: Secondary | ICD-10-CM | POA: Diagnosis not present

## 2018-07-08 DIAGNOSIS — G4730 Sleep apnea, unspecified: Secondary | ICD-10-CM | POA: Diagnosis not present

## 2018-07-08 DIAGNOSIS — Z955 Presence of coronary angioplasty implant and graft: Secondary | ICD-10-CM | POA: Diagnosis not present

## 2018-07-08 DIAGNOSIS — Z96651 Presence of right artificial knee joint: Secondary | ICD-10-CM | POA: Diagnosis not present

## 2018-07-08 DIAGNOSIS — Z886 Allergy status to analgesic agent status: Secondary | ICD-10-CM | POA: Insufficient documentation

## 2018-07-08 DIAGNOSIS — Z7982 Long term (current) use of aspirin: Secondary | ICD-10-CM | POA: Insufficient documentation

## 2018-07-08 DIAGNOSIS — M109 Gout, unspecified: Secondary | ICD-10-CM | POA: Diagnosis not present

## 2018-07-08 DIAGNOSIS — I10 Essential (primary) hypertension: Secondary | ICD-10-CM | POA: Diagnosis not present

## 2018-07-08 DIAGNOSIS — N882 Stricture and stenosis of cervix uteri: Secondary | ICD-10-CM | POA: Insufficient documentation

## 2018-07-08 DIAGNOSIS — N8500 Endometrial hyperplasia, unspecified: Secondary | ICD-10-CM | POA: Diagnosis not present

## 2018-07-08 DIAGNOSIS — M1990 Unspecified osteoarthritis, unspecified site: Secondary | ICD-10-CM | POA: Diagnosis not present

## 2018-07-08 DIAGNOSIS — M4802 Spinal stenosis, cervical region: Secondary | ICD-10-CM | POA: Diagnosis not present

## 2018-07-08 DIAGNOSIS — N950 Postmenopausal bleeding: Secondary | ICD-10-CM | POA: Diagnosis not present

## 2018-07-08 HISTORY — PX: CERVICAL POLYPECTOMY: SHX88

## 2018-07-08 HISTORY — PX: HYSTEROSCOPY WITH D & C: SHX1775

## 2018-07-08 SURGERY — DILATATION AND CURETTAGE /HYSTEROSCOPY
Anesthesia: General

## 2018-07-08 MED ORDER — ONDANSETRON 8 MG PO TBDP: 8.0000 mg | ORAL_TABLET | Freq: Three times a day (TID) | ORAL | 0 refills | Status: AC | PRN

## 2018-07-08 MED ORDER — KETOROLAC TROMETHAMINE 30 MG/ML IJ SOLN: 30.0000 mg | Freq: Once | INTRAMUSCULAR | Status: DC | PRN

## 2018-07-08 MED ORDER — LACTATED RINGERS IV SOLN
INTRAVENOUS | Status: DC
  Administered 2018-07-08: 1000 mL via INTRAVENOUS

## 2018-07-08 MED ORDER — ONDANSETRON HCL 4 MG/2ML IJ SOLN
INTRAMUSCULAR | Status: DC | PRN
  Administered 2018-07-08: 4 mg via INTRAVENOUS

## 2018-07-08 MED ORDER — GLYCOPYRROLATE PF 0.2 MG/ML IJ SOSY
PREFILLED_SYRINGE | INTRAMUSCULAR | Status: DC | PRN
  Administered 2018-07-08: .2 mg via INTRAVENOUS

## 2018-07-08 MED ORDER — KETOROLAC TROMETHAMINE 30 MG/ML IJ SOLN
30.0000 mg | Freq: Once | INTRAMUSCULAR | Status: AC
  Administered 2018-07-08: 30 mg via INTRAVENOUS
  Filled 2018-07-08: qty 1

## 2018-07-08 MED ORDER — HYDROMORPHONE HCL 1 MG/ML IJ SOLN: 0.2500 mg | INTRAMUSCULAR | Status: DC | PRN

## 2018-07-08 MED ORDER — HYDROCODONE-ACETAMINOPHEN 5-325 MG PO TABS: 1.0000 | ORAL_TABLET | Freq: Four times a day (QID) | ORAL | 0 refills | Status: AC | PRN

## 2018-07-08 MED ORDER — SUGAMMADEX SODIUM 200 MG/2ML IV SOLN
INTRAVENOUS | Status: AC
  Filled 2018-07-08: qty 2

## 2018-07-08 MED ORDER — PROPOFOL 10 MG/ML IV BOLUS
INTRAVENOUS | Status: DC | PRN
  Administered 2018-07-08: 140 mg via INTRAVENOUS
  Administered 2018-07-08: 30 mg via INTRAVENOUS
  Administered 2018-07-08: 60 mg via INTRAVENOUS

## 2018-07-08 MED ORDER — LIDOCAINE 2% (20 MG/ML) 5 ML SYRINGE
INTRAMUSCULAR | Status: DC | PRN
  Administered 2018-07-08: 40 mg via INTRAVENOUS

## 2018-07-08 MED ORDER — DEXAMETHASONE SODIUM PHOSPHATE 10 MG/ML IJ SOLN
INTRAMUSCULAR | Status: AC
  Filled 2018-07-08: qty 1

## 2018-07-08 MED ORDER — FENTANYL CITRATE (PF) 100 MCG/2ML IJ SOLN
INTRAMUSCULAR | Status: DC | PRN
  Administered 2018-07-08 (×2): 25 ug via INTRAVENOUS

## 2018-07-08 MED ORDER — PROPOFOL 10 MG/ML IV BOLUS
INTRAVENOUS | Status: AC
  Filled 2018-07-08: qty 40

## 2018-07-08 MED ORDER — ONDANSETRON HCL 4 MG/2ML IJ SOLN
INTRAMUSCULAR | Status: AC
  Filled 2018-07-08: qty 2

## 2018-07-08 MED ORDER — ROCURONIUM BROMIDE 10 MG/ML (PF) SYRINGE
PREFILLED_SYRINGE | INTRAVENOUS | Status: AC
  Filled 2018-07-08: qty 10

## 2018-07-08 MED ORDER — FENTANYL CITRATE (PF) 100 MCG/2ML IJ SOLN
INTRAMUSCULAR | Status: AC
  Filled 2018-07-08: qty 2

## 2018-07-08 MED ORDER — ONDANSETRON HCL 4 MG/2ML IJ SOLN: 4.0000 mg | Freq: Once | INTRAMUSCULAR | Status: DC | PRN

## 2018-07-08 MED ORDER — DEXAMETHASONE SODIUM PHOSPHATE 4 MG/ML IJ SOLN
INTRAMUSCULAR | Status: DC | PRN
  Administered 2018-07-08: 8 mg via INTRAVENOUS

## 2018-07-08 MED ORDER — LIDOCAINE 2% (20 MG/ML) 5 ML SYRINGE
INTRAMUSCULAR | Status: AC
  Filled 2018-07-08: qty 5

## 2018-07-08 MED ORDER — MEPERIDINE HCL 50 MG/ML IJ SOLN: 6.2500 mg | INTRAMUSCULAR | Status: DC | PRN

## 2018-07-08 MED ORDER — HYDROCODONE-ACETAMINOPHEN 7.5-325 MG PO TABS
1.0000 | ORAL_TABLET | Freq: Once | ORAL | Status: AC | PRN
  Administered 2018-07-08: 1 via ORAL
  Filled 2018-07-08: qty 1

## 2018-07-08 MED ORDER — GLYCOPYRROLATE PF 0.2 MG/ML IJ SOSY
PREFILLED_SYRINGE | INTRAMUSCULAR | Status: AC
  Filled 2018-07-08: qty 1

## 2018-07-08 MED ORDER — SODIUM CHLORIDE 0.9 % IR SOLN
Status: DC | PRN
  Administered 2018-07-08: 1000 mL
  Administered 2018-07-08: 3000 mL

## 2018-07-08 MED ORDER — SUCCINYLCHOLINE CHLORIDE 200 MG/10ML IV SOSY
PREFILLED_SYRINGE | INTRAVENOUS | Status: AC
  Filled 2018-07-08: qty 10

## 2018-07-08 MED ORDER — CEFAZOLIN SODIUM-DEXTROSE 2-4 GM/100ML-% IV SOLN
2.0000 g | INTRAVENOUS | Status: AC
  Administered 2018-07-08: 2 g via INTRAVENOUS
  Filled 2018-07-08: qty 100

## 2018-07-08 SURGICAL SUPPLY — 26 items
CLOTH BEACON ORANGE TIMEOUT ST (SAFETY) ×3 IMPLANT
COVER LIGHT HANDLE STERIS (MISCELLANEOUS) ×6 IMPLANT
COVER WAND RF STERILE (DRAPES) ×3 IMPLANT
GAUZE 4X4 16PLY RFD (DISPOSABLE) ×6 IMPLANT
GLOVE BIOGEL PI IND STRL 7.0 (GLOVE) ×4 IMPLANT
GLOVE BIOGEL PI IND STRL 8 (GLOVE) ×2 IMPLANT
GLOVE BIOGEL PI INDICATOR 7.0 (GLOVE) ×2
GLOVE BIOGEL PI INDICATOR 8 (GLOVE) ×1
GLOVE ECLIPSE 8.0 STRL XLNG CF (GLOVE) ×3 IMPLANT
GOWN STRL REUS W/TWL LRG LVL3 (GOWN DISPOSABLE) ×3 IMPLANT
GOWN STRL REUS W/TWL XL LVL3 (GOWN DISPOSABLE) ×3 IMPLANT
INST SET HYSTEROSCOPY (KITS) ×3 IMPLANT
IV NS 1000ML (IV SOLUTION) ×1
IV NS 1000ML BAXH (IV SOLUTION) ×2 IMPLANT
IV NS IRRIG 3000ML ARTHROMATIC (IV SOLUTION) ×3 IMPLANT
KIT TURNOVER CYSTO (KITS) ×3 IMPLANT
MANIFOLD NEPTUNE II (INSTRUMENTS) ×3 IMPLANT
MARKER SKIN DUAL TIP RULER LAB (MISCELLANEOUS) ×3 IMPLANT
NS IRRIG 1000ML POUR BTL (IV SOLUTION) ×3 IMPLANT
PACK BASIC III (CUSTOM PROCEDURE TRAY) ×1
PACK SRG BSC III STRL LF ECLPS (CUSTOM PROCEDURE TRAY) ×2 IMPLANT
PAD ARMBOARD 7.5X6 YLW CONV (MISCELLANEOUS) ×3 IMPLANT
PAD TELFA 3X4 1S STER (GAUZE/BANDAGES/DRESSINGS) ×6 IMPLANT
SET IRRIG Y TYPE TUR BLADDER L (SET/KITS/TRAYS/PACK) ×3 IMPLANT
SHEET LAVH (DRAPES) ×3 IMPLANT
YANKAUER SUCT BULB TIP 10FT TU (MISCELLANEOUS) ×3 IMPLANT

## 2018-07-08 NOTE — Op Note (Signed)
Preoperative diagnosis: Postmenopausal bleeding                                         Thickened endometrium,                                           Cervical stenosis   Postoperative diagnosis: Endometrial polyp   Procedure: Operative hysteroscopy with removal of polyp,  and endometrial curettage   Surgeon: Tania Ade H   Anesthesia: Laryngeal mask airway   Findings: The patient experienced postmenopausal bleeding and we did a sonogram in the office which revealed a thickened endometrial stripe of 12 mm.  An office endometrial biopsy was attempted but unsuccessful due to cervical stenosis  Hysteroscopy revealed a benign appearing endometrium with a single benign appearing polyp  Description of operation: Patient was taken to the operating room and placed in the supine position where she underwent laryngeal mask airway anesthesia. She was placed in the dorsal lithotomy position.  She was prepped and draped in the usual sterile fashion.  A Graves speculum was placed.  The cervix was grasped with a single-tooth tenaculum.  The cervix was dilated serially using pediatric dilators then Hegar dilators to allow passage of the hysteroscope.  The hysteroscope was then placed into the endometrial cavity without difficulty and the above noted findings were seen.  Randall stone forceps ring forceps and the uterine curet were used to remove the  polyp  The endometrium was thoroughly curetted with good uterine cry in all areas  Hysteroscopic reevaluation confirmed the removal of all endometrial pathology  There was good hemostasis with only suspected minor endometrial using  The patient tolerated the procedure well  She experienced minimal blood loss  She was taken to the recovery room in good stable condition  All counts were correct x3  She received 2 g of Ancef  preoperatively  She will be followed up in the office next week for postoperative visit and to review the pathology which I expect  to be benign  Florian Buff, MD  07/08/2018 8:16 AM

## 2018-07-08 NOTE — Anesthesia Postprocedure Evaluation (Signed)
Anesthesia Post Note  Patient: Cindy Roberts  Procedure(s) Performed: DILATATION AND CURETTAGE /HYSTEROSCOPY (N/A ) ENDOMETRIAL POLYPECTOMY  Patient location during evaluation: Short Stay Anesthesia Type: General Level of consciousness: awake and alert and patient cooperative Pain management: satisfactory to patient Vital Signs Assessment: post-procedure vital signs reviewed and stable Respiratory status: spontaneous breathing Cardiovascular status: stable Postop Assessment: no apparent nausea or vomiting Anesthetic complications: no     Last Vitals:  Vitals:   07/08/18 0830 07/08/18 0840  BP: 112/61 126/73  Pulse: 67 68  Resp: 14 16  Temp:  36.6 C  SpO2: 96% 94%    Last Pain:  Vitals:   07/08/18 0840  TempSrc: Oral  PainSc: 0-No pain                 [name]

## 2018-07-08 NOTE — Transfer of Care (Signed)
Immediate Anesthesia Transfer of Care Note  Patient: Cindy Roberts  Procedure(s) Performed: DILATATION AND CURETTAGE /HYSTEROSCOPY (N/A )  Patient Location: PACU  Anesthesia Type:General  Level of Consciousness: awake, alert , oriented and patient cooperative  Airway & Oxygen Therapy: Patient Spontanous Breathing  Post-op Assessment: Report given to RN and Post -op Vital signs reviewed and stable  Post vital signs: Reviewed and stable  Last Vitals:  Vitals Value Taken Time  BP 117/74 07/08/2018  8:11 AM  Temp    Pulse 71 07/08/2018  8:12 AM  Resp 19 07/08/2018  8:12 AM  SpO2 96 % 07/08/2018  8:12 AM  Vitals shown include unvalidated device data.  Last Pain:  Vitals:   07/08/18 0636  TempSrc: Oral  PainSc: 0-No pain      Patients Stated Pain Goal: 5 (26/94/85 [id number])  Complications: No apparent anesthesia complications

## 2018-07-08 NOTE — Anesthesia Procedure Notes (Signed)
Procedure Name: LMA Insertion Date/Time: 07/08/2018 7:31 AM Performed by: Andree Elk, Treyshaun Keatts A, CRNA Pre-anesthesia Checklist: Patient identified, Emergency Drugs available, Suction available and Patient being monitored Patient Re-evaluated:Patient Re-evaluated prior to induction Oxygen Delivery Method: Circle system utilized Preoxygenation: Pre-oxygenation with 100% oxygen Induction Type: IV induction LMA: LMA inserted LMA Size: 4.0 Number of attempts: 1 Placement Confirmation: positive ETCO2 and breath sounds checked- equal and bilateral Tube secured with: Tape Dental Injury: Teeth and Oropharynx as per pre-operative assessment

## 2018-07-08 NOTE — Progress Notes (Signed)
Please excuse Cindy Roberts from work today February 5th, 2020. 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

## 2018-07-08 NOTE — Discharge Instructions (Signed)
Dilation and Curettage or Vacuum Curettage, Care After These instructions give you information about caring for yourself after your procedure. Your doctor may also give you more specific instructions. Call your doctor if you have any problems or questions after your procedure. Follow these instructions at home: Activity  Do not drive or use heavy machinery while taking prescription pain medicine.  For 24 hours after your procedure, avoid driving.  Take short walks often, followed by rest periods. Ask your doctor what activities are safe for you. After one or two days, you may be able to return to your normal activities.  Do not lift anything that is heavier than 10 lb (4.5 kg) until your doctor approves.  For at least 2 weeks, or as long as told by your doctor: ? Do not douche. ? Do not use tampons. ? Do not have sex. General instructions   Take over-the-counter and prescription medicines only as told by your doctor. This is very important if you take blood thinning medicine.  Do not take baths, swim, or use a hot tub until your doctor approves. Take showers instead of baths.  Wear compression stockings as told by your doctor.  It is up to you to get the results of your procedure. Ask your doctor when your results will be ready.  Keep all follow-up visits as told by your doctor. This is important. Contact a doctor if:  You have very bad cramps that get worse or do not get better with medicine.  You have very bad pain in your belly (abdomen).  You cannot drink fluids without throwing up (vomiting).  You get pain in a different part of the area between your belly and thighs (pelvis).  You have bad-smelling discharge from your vagina.  You have a rash. Get help right away if:  You are bleeding a lot from your vagina. A lot of bleeding means soaking more than one sanitary pad in an hour, for 2 hours in a row.  You have clumps of blood (blood clots) coming from your  vagina.  You have a fever or chills.  Your belly feels very tender or hard.  You have chest pain.  You have trouble breathing.  You cough up blood.  You feel dizzy.  You feel light-headed.  You pass out (faint).  You have pain in your neck or shoulder area. Summary  Take short walks often, followed by rest periods. Ask your doctor what activities are safe for you. After one or two days, you may be able to return to your normal activities.  Do not lift anything that is heavier than 10 lb (4.5 kg) until your doctor approves.  Do not take baths, swim, or use a hot tub until your doctor approves. Take showers instead of baths.  Contact your doctor if you have any symptoms of infection, like bad-smelling discharge from your vagina. This information is not intended to replace advice given to you by your health care provider. Make sure you discuss any questions you have with your health care provider. Document Released: 02/27/2008 Document Revised: 02/05/2016 Document Reviewed: 02/05/2016 Elsevier Interactive Patient Education  2019 [place] Anesthesia, Adult, Care After This sheet gives you information about how to care for yourself after your procedure. Your health care provider may also give you more specific instructions. If you have problems or questions, contact your health care provider. What can I expect after the procedure? After the procedure, the following side effects are  common:  Pain or discomfort at the IV site.  Nausea.  Vomiting.  Sore throat.  Trouble concentrating.  Feeling cold or chills.  Weak or tired.  Sleepiness and fatigue.  Soreness and body aches. These side effects can affect parts of the body that were not involved in surgery. Follow these instructions at home:  For at least 24 hours after the procedure:  Have a responsible adult stay with you. It is important to have someone help care for you until you are awake and  alert.  Rest as needed.  Do not: ? Participate in activities in which you could fall or become injured. ? Drive. ? Use heavy machinery. ? Drink alcohol. ? Take sleeping pills or medicines that cause drowsiness. ? Make important decisions or sign legal documents. ? Take care of children on your own. Eating and drinking  Follow any instructions from your health care provider about eating or drinking restrictions.  When you feel hungry, start by eating small amounts of foods that are soft and easy to digest (bland), such as toast. Gradually return to your regular diet.  Drink enough fluid to keep your urine pale yellow.  If you vomit, rehydrate by drinking water, juice, or clear broth. General instructions  If you have sleep apnea, surgery and certain medicines can increase your risk for breathing problems. Follow instructions from your health care provider about wearing your sleep device: ? Anytime you are sleeping, including during daytime naps. ? While taking prescription pain medicines, sleeping medicines, or medicines that make you drowsy.  Return to your normal activities as told by your health care provider. Ask your health care provider what activities are safe for you.  Take over-the-counter and prescription medicines only as told by your health care provider.  If you smoke, do not smoke without supervision.  Keep all follow-up visits as told by your health care provider. This is important. Contact a health care provider if:  You have nausea or vomiting that does not get better with medicine.  You cannot eat or drink without vomiting.  You have pain that does not get better with medicine.  You are unable to pass urine.  You develop a skin rash.  You have a fever.  You have redness around your IV site that gets worse. Get help right away if:  You have difficulty breathing.  You have chest pain.  You have blood in your urine or stool, or you vomit  blood. Summary  After the procedure, it is common to have a sore throat or nausea. It is also common to feel tired.  Have a responsible adult stay with you for the first 24 hours after general anesthesia. It is important to have someone help care for you until you are awake and alert.  When you feel hungry, start by eating small amounts of foods that are soft and easy to digest (bland), such as toast. Gradually return to your regular diet.  Drink enough fluid to keep your urine pale yellow.  Return to your normal activities as told by your health care provider. Ask your health care provider what activities are safe for you. This information is not intended to replace advice given to you by your health care provider. Make sure you discuss any questions you have with your health care provider. Document Released: 08/26/2000 Document Revised: 01/03/2017 Document Reviewed: 01/03/2017 Elsevier Interactive Patient Education  2019 [employer].

## 2018-07-08 NOTE — Interval H&P Note (Signed)
History and Physical Interval Note:  07/08/2018 7:12 AM  Cindy Roberts  has presented today for surgery, with the diagnosis of postmenopausal bleeding  The various methods of treatment have been discussed with the patient and family. After consideration of risks, benefits and other options for treatment, the patient has consented to  Procedure(s): DILATATION AND CURETTAGE /HYSTEROSCOPY (N/A) as a surgical intervention .  The patient's history has been reviewed, patient examined, no change in status, stable for surgery.  I have reviewed the patient's chart and labs.  Questions were answered to the patient's satisfaction.     Florian Buff

## 2018-07-08 NOTE — Anesthesia Preprocedure Evaluation (Signed)
Anesthesia Evaluation  Patient identified by MRN, date of birth, ID band Patient awake    Reviewed: Allergy & Precautions, H&P , NPO status , Patient's Chart, lab work & pertinent test results, reviewed documented beta blocker date and time   Airway Mallampati: I  TM Distance: >3 FB Neck ROM: full    Dental no notable dental hx. (+) Edentulous Lower, Edentulous Upper   Pulmonary sleep apnea ,    Pulmonary exam normal breath sounds clear to auscultation       Cardiovascular Exercise Tolerance: Good hypertension, negative cardio ROS   Rhythm:regular Rate:Normal     Neuro/Psych negative neurological ROS  negative psych ROS   GI/Hepatic Neg liver ROS, GERD  ,  Endo/Other  Morbid obesity  Renal/GU negative Renal ROS  negative genitourinary   Musculoskeletal   Abdominal   Peds  Hematology negative hematology ROS (+)   Anesthesia Other Findings   Reproductive/Obstetrics negative OB ROS                             Anesthesia Physical Anesthesia Plan  ASA: III  Anesthesia Plan: General   Post-op Pain Management:    Induction:   PONV Risk Score and Plan:   Airway Management Planned:   Additional Equipment:   Intra-op Plan:   Post-operative Plan:   Informed Consent: I have reviewed the patients History and Physical, chart, labs and discussed the procedure including the risks, benefits and alternatives for the proposed anesthesia with the patient or authorized representative who has indicated his/her understanding and acceptance.     Dental Advisory Given  Plan Discussed with: CRNA  Anesthesia Plan Comments:         Anesthesia Quick Evaluation

## 2018-07-09 ENCOUNTER — Encounter (HOSPITAL_COMMUNITY): Payer: Self-pay | Admitting: Obstetrics & Gynecology

## 2018-07-09 DIAGNOSIS — N840 Polyp of corpus uteri: Secondary | ICD-10-CM | POA: Diagnosis not present

## 2018-07-14 ENCOUNTER — Encounter: Payer: Medicare HMO | Admitting: Obstetrics & Gynecology

## 2018-07-21 ENCOUNTER — Ambulatory Visit: Payer: Medicare HMO | Admitting: Obstetrics & Gynecology

## 2018-07-21 ENCOUNTER — Encounter: Payer: Self-pay | Admitting: Obstetrics & Gynecology

## 2018-07-21 VITALS — BP 139/79 | HR 84 | Ht 62.0 in | Wt 258.0 lb

## 2018-07-21 DIAGNOSIS — Z9889 Other specified postprocedural states: Secondary | ICD-10-CM

## 2018-07-21 DIAGNOSIS — N950 Postmenopausal bleeding: Secondary | ICD-10-CM | POA: Diagnosis not present

## 2018-07-21 NOTE — Progress Notes (Signed)
  HPI: Patient returns for routine postoperative follow-up having undergone hysteroscopy uterine curettage on 2/12/202.  The patient's immediate postoperative recovery has been unremarkable. Since hospital discharge the patient reports no problems.   Current Outpatient Medications: allopurinol (ZYLOPRIM) 300 MG tablet, Take 300 mg by mouth daily., Disp: , Rfl:  atorvastatin (LIPITOR) 40 MG tablet, Take 40 mg by mouth at bedtime., Disp: , Rfl:  esomeprazole (NEXIUM) 40 MG capsule, Take 40 mg by mouth daily as needed (heartburn). , Disp: , Rfl:  felodipine (PLENDIL) 10 MG 24 hr tablet, Take 10 mg by mouth daily., Disp: , Rfl:  ferrous sulfate 325 (65 FE) MG tablet, Take 325 mg by mouth daily with breakfast., Disp: , Rfl:  furosemide (LASIX) 20 MG tablet, Take 20 mg by mouth daily as needed for fluid or edema. , Disp: , Rfl:  HYDROcodone-acetaminophen (NORCO/VICODIN) 5-325 MG tablet, Take 1 tablet by mouth every 6 (six) hours as needed., Disp: 10 tablet, Rfl: 0 isosorbide dinitrate (ISORDIL) 20 MG tablet, Take 20 mg by mouth every evening., Disp: , Rfl:  metoprolol succinate (TOPROL-XL) 50 MG 24 hr tablet, Take 50 mg by mouth daily. Take with or immediately following a meal., Disp: , Rfl:  nitroGLYCERIN (NITROSTAT) 0.4 MG SL tablet, Place 0.4 mg under the tongue every 5 (five) minutes as needed for chest pain. DO NOT EXCEED THREE DOSES , THEN CALL 911, Disp: , Rfl:  ondansetron (ZOFRAN ODT) 8 MG disintegrating tablet, Take 1 tablet (8 mg total) by mouth every 8 (eight) hours as needed for nausea or vomiting., Disp: 20 tablet, Rfl: 0 quinapril (ACCUPRIL) 40 MG tablet, Take 40 mg by mouth daily. , Disp: , Rfl:  aspirin EC 81 MG tablet, Take 81 mg by mouth daily., Disp: , Rfl:   No current facility-administered medications for this visit.     Blood pressure 139/79, pulse 84, height 5\' 2"  (1.575 m), weight 258 lb (117 kg).   Physical Exam: Normal post ablation  Diagnostic  Tests:   Pathology: Benign polyps  Impression: S/p hysteroscopy and removal of polyps  Plan:   Follow up: 1  years  Florian Buff, MD

## 2018-08-10 DIAGNOSIS — M25551 Pain in right hip: Secondary | ICD-10-CM | POA: Diagnosis not present

## 2018-11-23 DIAGNOSIS — I2510 Atherosclerotic heart disease of native coronary artery without angina pectoris: Secondary | ICD-10-CM | POA: Diagnosis not present

## 2018-11-23 DIAGNOSIS — G4733 Obstructive sleep apnea (adult) (pediatric): Secondary | ICD-10-CM | POA: Diagnosis not present

## 2018-11-23 DIAGNOSIS — R600 Localized edema: Secondary | ICD-10-CM | POA: Diagnosis not present

## 2018-11-23 DIAGNOSIS — I10 Essential (primary) hypertension: Secondary | ICD-10-CM | POA: Diagnosis not present

## 2018-11-23 DIAGNOSIS — E782 Mixed hyperlipidemia: Secondary | ICD-10-CM | POA: Diagnosis not present

## 2018-12-08 DIAGNOSIS — E782 Mixed hyperlipidemia: Secondary | ICD-10-CM | POA: Diagnosis not present

## 2018-12-08 DIAGNOSIS — G4733 Obstructive sleep apnea (adult) (pediatric): Secondary | ICD-10-CM | POA: Diagnosis not present

## 2018-12-08 DIAGNOSIS — R600 Localized edema: Secondary | ICD-10-CM | POA: Diagnosis not present

## 2018-12-08 DIAGNOSIS — N183 Chronic kidney disease, stage 3 (moderate): Secondary | ICD-10-CM | POA: Diagnosis not present

## 2018-12-08 DIAGNOSIS — I10 Essential (primary) hypertension: Secondary | ICD-10-CM | POA: Diagnosis not present

## 2018-12-08 DIAGNOSIS — I2510 Atherosclerotic heart disease of native coronary artery without angina pectoris: Secondary | ICD-10-CM | POA: Diagnosis not present

## 2018-12-14 DIAGNOSIS — N644 Mastodynia: Secondary | ICD-10-CM | POA: Diagnosis not present

## 2019-01-27 DIAGNOSIS — R69 Illness, unspecified: Secondary | ICD-10-CM | POA: Diagnosis not present

## 2019-02-23 DIAGNOSIS — N183 Chronic kidney disease, stage 3 (moderate): Secondary | ICD-10-CM | POA: Diagnosis not present

## 2019-02-23 DIAGNOSIS — M10379 Gout due to renal impairment, unspecified ankle and foot: Secondary | ICD-10-CM | POA: Diagnosis not present

## 2019-02-23 DIAGNOSIS — I129 Hypertensive chronic kidney disease with stage 1 through stage 4 chronic kidney disease, or unspecified chronic kidney disease: Secondary | ICD-10-CM | POA: Diagnosis not present

## 2019-02-23 DIAGNOSIS — N2581 Secondary hyperparathyroidism of renal origin: Secondary | ICD-10-CM | POA: Diagnosis not present

## 2019-03-31 DIAGNOSIS — Z1231 Encounter for screening mammogram for malignant neoplasm of breast: Secondary | ICD-10-CM | POA: Diagnosis not present

## 2019-04-13 ENCOUNTER — Other Ambulatory Visit (HOSPITAL_COMMUNITY): Payer: Self-pay | Admitting: Family

## 2019-04-13 DIAGNOSIS — R928 Other abnormal and inconclusive findings on diagnostic imaging of breast: Secondary | ICD-10-CM

## 2019-05-11 DIAGNOSIS — I129 Hypertensive chronic kidney disease with stage 1 through stage 4 chronic kidney disease, or unspecified chronic kidney disease: Secondary | ICD-10-CM | POA: Diagnosis not present

## 2019-05-11 DIAGNOSIS — N183 Chronic kidney disease, stage 3 unspecified: Secondary | ICD-10-CM | POA: Diagnosis not present

## 2019-06-02 DIAGNOSIS — I10 Essential (primary) hypertension: Secondary | ICD-10-CM | POA: Diagnosis not present

## 2019-06-02 DIAGNOSIS — D649 Anemia, unspecified: Secondary | ICD-10-CM | POA: Diagnosis not present

## 2019-06-02 DIAGNOSIS — M109 Gout, unspecified: Secondary | ICD-10-CM | POA: Diagnosis not present

## 2019-06-02 DIAGNOSIS — E559 Vitamin D deficiency, unspecified: Secondary | ICD-10-CM | POA: Diagnosis not present

## 2019-06-02 DIAGNOSIS — E782 Mixed hyperlipidemia: Secondary | ICD-10-CM | POA: Diagnosis not present

## 2019-06-02 DIAGNOSIS — Z6841 Body Mass Index (BMI) 40.0 and over, adult: Secondary | ICD-10-CM | POA: Diagnosis not present

## 2019-06-02 DIAGNOSIS — N183 Chronic kidney disease, stage 3 unspecified: Secondary | ICD-10-CM | POA: Diagnosis not present

## 2019-06-07 ENCOUNTER — Other Ambulatory Visit (HOSPITAL_COMMUNITY): Payer: Self-pay | Admitting: Family

## 2019-06-07 DIAGNOSIS — N6489 Other specified disorders of breast: Secondary | ICD-10-CM

## 2019-06-08 ENCOUNTER — Other Ambulatory Visit: Payer: Self-pay

## 2019-06-08 ENCOUNTER — Ambulatory Visit (HOSPITAL_COMMUNITY): Admission: RE | Admit: 2019-06-08 | Payer: Medicare HMO | Source: Ambulatory Visit

## 2019-06-08 ENCOUNTER — Ambulatory Visit (HOSPITAL_COMMUNITY)
Admission: RE | Admit: 2019-06-08 | Discharge: 2019-06-08 | Disposition: A | Discharge status: Home / Self Care | Payer: Medicare HMO | Source: Ambulatory Visit | Attending: Family | Admitting: Family

## 2019-06-08 DIAGNOSIS — R922 Inconclusive mammogram: Secondary | ICD-10-CM | POA: Diagnosis not present

## 2019-06-08 DIAGNOSIS — N6489 Other specified disorders of breast: Secondary | ICD-10-CM

## 2019-06-21 DIAGNOSIS — G4733 Obstructive sleep apnea (adult) (pediatric): Secondary | ICD-10-CM | POA: Diagnosis not present

## 2019-06-21 DIAGNOSIS — I2510 Atherosclerotic heart disease of native coronary artery without angina pectoris: Secondary | ICD-10-CM | POA: Diagnosis not present

## 2019-06-21 DIAGNOSIS — E782 Mixed hyperlipidemia: Secondary | ICD-10-CM | POA: Diagnosis not present

## 2019-06-21 DIAGNOSIS — I10 Essential (primary) hypertension: Secondary | ICD-10-CM | POA: Diagnosis not present

## 2019-06-21 DIAGNOSIS — N1831 Chronic kidney disease, stage 3a: Secondary | ICD-10-CM | POA: Diagnosis not present

## 2019-10-19 DIAGNOSIS — N2581 Secondary hyperparathyroidism of renal origin: Secondary | ICD-10-CM | POA: Diagnosis not present

## 2019-10-19 DIAGNOSIS — R809 Proteinuria, unspecified: Secondary | ICD-10-CM | POA: Diagnosis not present

## 2019-10-19 DIAGNOSIS — I129 Hypertensive chronic kidney disease with stage 1 through stage 4 chronic kidney disease, or unspecified chronic kidney disease: Secondary | ICD-10-CM | POA: Diagnosis not present

## 2019-10-19 DIAGNOSIS — D631 Anemia in chronic kidney disease: Secondary | ICD-10-CM | POA: Diagnosis not present

## 2019-10-19 DIAGNOSIS — N184 Chronic kidney disease, stage 4 (severe): Secondary | ICD-10-CM | POA: Diagnosis not present

## 2019-10-26 ENCOUNTER — Other Ambulatory Visit (HOSPITAL_COMMUNITY): Payer: Self-pay | Admitting: Student

## 2019-10-26 ENCOUNTER — Telehealth: Payer: Self-pay | Admitting: Student

## 2019-10-26 ENCOUNTER — Other Ambulatory Visit: Payer: Self-pay | Admitting: Student

## 2019-10-26 DIAGNOSIS — N184 Chronic kidney disease, stage 4 (severe): Secondary | ICD-10-CM

## 2019-10-26 NOTE — Telephone Encounter (Signed)
10/26/19~LM on VM. MF

## 2020-02-22 DIAGNOSIS — I10 Essential (primary) hypertension: Secondary | ICD-10-CM | POA: Diagnosis not present

## 2020-02-22 DIAGNOSIS — N2581 Secondary hyperparathyroidism of renal origin: Secondary | ICD-10-CM | POA: Diagnosis not present

## 2020-02-22 DIAGNOSIS — N1832 Chronic kidney disease, stage 3b: Secondary | ICD-10-CM | POA: Diagnosis not present

## 2020-02-22 DIAGNOSIS — R600 Localized edema: Secondary | ICD-10-CM | POA: Diagnosis not present

## 2020-02-22 DIAGNOSIS — R809 Proteinuria, unspecified: Secondary | ICD-10-CM | POA: Diagnosis not present

## 2020-02-22 DIAGNOSIS — D631 Anemia in chronic kidney disease: Secondary | ICD-10-CM | POA: Diagnosis not present

## 2020-04-06 DIAGNOSIS — Z23 Encounter for immunization: Secondary | ICD-10-CM | POA: Diagnosis not present

## 2020-06-27 DIAGNOSIS — I129 Hypertensive chronic kidney disease with stage 1 through stage 4 chronic kidney disease, or unspecified chronic kidney disease: Secondary | ICD-10-CM | POA: Diagnosis not present

## 2020-06-27 DIAGNOSIS — R8290 Unspecified abnormal findings in urine: Secondary | ICD-10-CM | POA: Diagnosis not present

## 2020-06-27 DIAGNOSIS — N184 Chronic kidney disease, stage 4 (severe): Secondary | ICD-10-CM | POA: Diagnosis not present

## 2020-06-27 DIAGNOSIS — M10379 Gout due to renal impairment, unspecified ankle and foot: Secondary | ICD-10-CM | POA: Diagnosis not present

## 2020-06-27 DIAGNOSIS — N2581 Secondary hyperparathyroidism of renal origin: Secondary | ICD-10-CM | POA: Diagnosis not present

## 2020-08-07 DIAGNOSIS — R69 Illness, unspecified: Secondary | ICD-10-CM | POA: Diagnosis not present

## 2020-08-07 DIAGNOSIS — Z955 Presence of coronary angioplasty implant and graft: Secondary | ICD-10-CM | POA: Diagnosis not present

## 2020-08-07 DIAGNOSIS — Z79899 Other long term (current) drug therapy: Secondary | ICD-10-CM | POA: Diagnosis not present

## 2020-08-07 DIAGNOSIS — N182 Chronic kidney disease, stage 2 (mild): Secondary | ICD-10-CM | POA: Diagnosis not present

## 2020-08-07 DIAGNOSIS — E559 Vitamin D deficiency, unspecified: Secondary | ICD-10-CM | POA: Diagnosis not present

## 2020-08-07 DIAGNOSIS — K219 Gastro-esophageal reflux disease without esophagitis: Secondary | ICD-10-CM | POA: Diagnosis not present

## 2020-08-07 DIAGNOSIS — E782 Mixed hyperlipidemia: Secondary | ICD-10-CM | POA: Diagnosis not present

## 2020-08-07 DIAGNOSIS — I10 Essential (primary) hypertension: Secondary | ICD-10-CM | POA: Diagnosis not present

## 2020-08-07 DIAGNOSIS — I872 Venous insufficiency (chronic) (peripheral): Secondary | ICD-10-CM | POA: Diagnosis not present

## 2020-08-07 DIAGNOSIS — Z1231 Encounter for screening mammogram for malignant neoplasm of breast: Secondary | ICD-10-CM | POA: Diagnosis not present

## 2020-08-07 DIAGNOSIS — R7303 Prediabetes: Secondary | ICD-10-CM | POA: Diagnosis not present

## 2020-08-07 DIAGNOSIS — Z131 Encounter for screening for diabetes mellitus: Secondary | ICD-10-CM | POA: Diagnosis not present

## 2020-09-16 DIAGNOSIS — N189 Chronic kidney disease, unspecified: Secondary | ICD-10-CM | POA: Diagnosis not present

## 2020-09-16 DIAGNOSIS — Z79899 Other long term (current) drug therapy: Secondary | ICD-10-CM | POA: Diagnosis not present

## 2020-09-16 DIAGNOSIS — I2510 Atherosclerotic heart disease of native coronary artery without angina pectoris: Secondary | ICD-10-CM | POA: Diagnosis not present

## 2020-09-16 DIAGNOSIS — I129 Hypertensive chronic kidney disease with stage 1 through stage 4 chronic kidney disease, or unspecified chronic kidney disease: Secondary | ICD-10-CM | POA: Diagnosis not present

## 2020-09-16 DIAGNOSIS — E039 Hypothyroidism, unspecified: Secondary | ICD-10-CM | POA: Diagnosis not present

## 2020-09-16 DIAGNOSIS — Z888 Allergy status to other drugs, medicaments and biological substances status: Secondary | ICD-10-CM | POA: Diagnosis not present

## 2020-09-16 DIAGNOSIS — E785 Hyperlipidemia, unspecified: Secondary | ICD-10-CM | POA: Diagnosis not present

## 2020-09-16 DIAGNOSIS — R42 Dizziness and giddiness: Secondary | ICD-10-CM | POA: Diagnosis not present

## 2020-09-16 DIAGNOSIS — Z91013 Allergy to seafood: Secondary | ICD-10-CM | POA: Diagnosis not present

## 2020-09-16 DIAGNOSIS — E875 Hyperkalemia: Secondary | ICD-10-CM | POA: Diagnosis not present

## 2020-09-16 DIAGNOSIS — I10 Essential (primary) hypertension: Secondary | ICD-10-CM | POA: Diagnosis not present

## 2020-09-20 DIAGNOSIS — R1011 Right upper quadrant pain: Secondary | ICD-10-CM | POA: Diagnosis not present

## 2020-09-20 DIAGNOSIS — E860 Dehydration: Secondary | ICD-10-CM | POA: Diagnosis not present

## 2020-09-20 DIAGNOSIS — R6883 Chills (without fever): Secondary | ICD-10-CM | POA: Diagnosis not present

## 2020-09-20 DIAGNOSIS — R93422 Abnormal radiologic findings on diagnostic imaging of left kidney: Secondary | ICD-10-CM | POA: Diagnosis not present

## 2020-09-20 DIAGNOSIS — I160 Hypertensive urgency: Secondary | ICD-10-CM | POA: Diagnosis not present

## 2020-09-20 DIAGNOSIS — N1832 Chronic kidney disease, stage 3b: Secondary | ICD-10-CM | POA: Diagnosis not present

## 2020-09-20 DIAGNOSIS — E871 Hypo-osmolality and hyponatremia: Secondary | ICD-10-CM | POA: Diagnosis not present

## 2020-09-20 DIAGNOSIS — R110 Nausea: Secondary | ICD-10-CM | POA: Diagnosis not present

## 2020-09-20 DIAGNOSIS — I1310 Hypertensive heart and chronic kidney disease without heart failure, with stage 1 through stage 4 chronic kidney disease, or unspecified chronic kidney disease: Secondary | ICD-10-CM | POA: Diagnosis not present

## 2020-09-20 DIAGNOSIS — E878 Other disorders of electrolyte and fluid balance, not elsewhere classified: Secondary | ICD-10-CM | POA: Diagnosis not present

## 2020-09-20 DIAGNOSIS — Z20822 Contact with and (suspected) exposure to covid-19: Secondary | ICD-10-CM | POA: Diagnosis not present

## 2020-09-21 DIAGNOSIS — E860 Dehydration: Secondary | ICD-10-CM | POA: Diagnosis not present

## 2020-09-21 DIAGNOSIS — E878 Other disorders of electrolyte and fluid balance, not elsewhere classified: Secondary | ICD-10-CM | POA: Diagnosis not present

## 2020-09-21 DIAGNOSIS — I160 Hypertensive urgency: Secondary | ICD-10-CM | POA: Diagnosis not present

## 2020-09-21 DIAGNOSIS — E871 Hypo-osmolality and hyponatremia: Secondary | ICD-10-CM | POA: Diagnosis not present

## 2020-09-21 DIAGNOSIS — R93422 Abnormal radiologic findings on diagnostic imaging of left kidney: Secondary | ICD-10-CM | POA: Diagnosis not present

## 2020-09-21 DIAGNOSIS — I1310 Hypertensive heart and chronic kidney disease without heart failure, with stage 1 through stage 4 chronic kidney disease, or unspecified chronic kidney disease: Secondary | ICD-10-CM | POA: Diagnosis not present

## 2020-09-21 DIAGNOSIS — I2510 Atherosclerotic heart disease of native coronary artery without angina pectoris: Secondary | ICD-10-CM | POA: Diagnosis not present

## 2020-09-21 DIAGNOSIS — R42 Dizziness and giddiness: Secondary | ICD-10-CM | POA: Diagnosis not present

## 2020-09-21 DIAGNOSIS — R1011 Right upper quadrant pain: Secondary | ICD-10-CM | POA: Diagnosis not present

## 2020-09-21 DIAGNOSIS — N1832 Chronic kidney disease, stage 3b: Secondary | ICD-10-CM | POA: Diagnosis not present

## 2020-09-22 DIAGNOSIS — I160 Hypertensive urgency: Secondary | ICD-10-CM | POA: Diagnosis not present

## 2020-09-22 DIAGNOSIS — E878 Other disorders of electrolyte and fluid balance, not elsewhere classified: Secondary | ICD-10-CM | POA: Diagnosis not present

## 2020-09-22 DIAGNOSIS — R93422 Abnormal radiologic findings on diagnostic imaging of left kidney: Secondary | ICD-10-CM | POA: Diagnosis not present

## 2020-09-22 DIAGNOSIS — E871 Hypo-osmolality and hyponatremia: Secondary | ICD-10-CM | POA: Diagnosis not present

## 2020-09-22 DIAGNOSIS — R1011 Right upper quadrant pain: Secondary | ICD-10-CM | POA: Diagnosis not present

## 2020-09-22 DIAGNOSIS — I1310 Hypertensive heart and chronic kidney disease without heart failure, with stage 1 through stage 4 chronic kidney disease, or unspecified chronic kidney disease: Secondary | ICD-10-CM | POA: Diagnosis not present

## 2020-09-22 DIAGNOSIS — I2510 Atherosclerotic heart disease of native coronary artery without angina pectoris: Secondary | ICD-10-CM | POA: Diagnosis not present

## 2020-09-22 DIAGNOSIS — E860 Dehydration: Secondary | ICD-10-CM | POA: Diagnosis not present

## 2020-09-22 DIAGNOSIS — N1832 Chronic kidney disease, stage 3b: Secondary | ICD-10-CM | POA: Diagnosis not present

## 2020-09-25 DIAGNOSIS — Z79899 Other long term (current) drug therapy: Secondary | ICD-10-CM | POA: Diagnosis not present

## 2020-09-25 DIAGNOSIS — E559 Vitamin D deficiency, unspecified: Secondary | ICD-10-CM | POA: Diagnosis not present

## 2020-09-25 DIAGNOSIS — Z131 Encounter for screening for diabetes mellitus: Secondary | ICD-10-CM | POA: Diagnosis not present

## 2020-09-25 DIAGNOSIS — K219 Gastro-esophageal reflux disease without esophagitis: Secondary | ICD-10-CM | POA: Diagnosis not present

## 2020-09-25 DIAGNOSIS — N2889 Other specified disorders of kidney and ureter: Secondary | ICD-10-CM | POA: Diagnosis not present

## 2020-09-25 DIAGNOSIS — M1000 Idiopathic gout, unspecified site: Secondary | ICD-10-CM | POA: Diagnosis not present

## 2020-09-25 DIAGNOSIS — R69 Illness, unspecified: Secondary | ICD-10-CM | POA: Diagnosis not present

## 2020-09-25 DIAGNOSIS — N184 Chronic kidney disease, stage 4 (severe): Secondary | ICD-10-CM | POA: Diagnosis not present

## 2020-09-25 DIAGNOSIS — Z955 Presence of coronary angioplasty implant and graft: Secondary | ICD-10-CM | POA: Diagnosis not present

## 2020-09-25 DIAGNOSIS — D631 Anemia in chronic kidney disease: Secondary | ICD-10-CM | POA: Diagnosis not present

## 2020-09-25 DIAGNOSIS — I872 Venous insufficiency (chronic) (peripheral): Secondary | ICD-10-CM | POA: Diagnosis not present

## 2020-09-25 DIAGNOSIS — E782 Mixed hyperlipidemia: Secondary | ICD-10-CM | POA: Diagnosis not present

## 2020-09-25 DIAGNOSIS — I10 Essential (primary) hypertension: Secondary | ICD-10-CM | POA: Diagnosis not present

## 2020-09-25 DIAGNOSIS — Z1231 Encounter for screening mammogram for malignant neoplasm of breast: Secondary | ICD-10-CM | POA: Diagnosis not present

## 2020-10-02 DIAGNOSIS — D631 Anemia in chronic kidney disease: Secondary | ICD-10-CM | POA: Diagnosis not present

## 2020-10-02 DIAGNOSIS — N184 Chronic kidney disease, stage 4 (severe): Secondary | ICD-10-CM | POA: Diagnosis not present

## 2020-10-03 DIAGNOSIS — E870 Hyperosmolality and hypernatremia: Secondary | ICD-10-CM | POA: Diagnosis not present

## 2020-10-10 DIAGNOSIS — I951 Orthostatic hypotension: Secondary | ICD-10-CM | POA: Diagnosis not present

## 2020-10-10 DIAGNOSIS — G3184 Mild cognitive impairment, so stated: Secondary | ICD-10-CM | POA: Diagnosis not present

## 2020-10-10 DIAGNOSIS — E785 Hyperlipidemia, unspecified: Secondary | ICD-10-CM | POA: Diagnosis not present

## 2020-10-10 DIAGNOSIS — I25119 Atherosclerotic heart disease of native coronary artery with unspecified angina pectoris: Secondary | ICD-10-CM | POA: Diagnosis not present

## 2020-10-10 DIAGNOSIS — Z6841 Body Mass Index (BMI) 40.0 and over, adult: Secondary | ICD-10-CM | POA: Diagnosis not present

## 2020-10-10 DIAGNOSIS — I10 Essential (primary) hypertension: Secondary | ICD-10-CM | POA: Diagnosis not present

## 2020-10-10 DIAGNOSIS — R69 Illness, unspecified: Secondary | ICD-10-CM | POA: Diagnosis not present

## 2020-10-10 DIAGNOSIS — G4733 Obstructive sleep apnea (adult) (pediatric): Secondary | ICD-10-CM | POA: Diagnosis not present

## 2020-10-10 DIAGNOSIS — G4700 Insomnia, unspecified: Secondary | ICD-10-CM | POA: Diagnosis not present

## 2020-10-12 DIAGNOSIS — Z1231 Encounter for screening mammogram for malignant neoplasm of breast: Secondary | ICD-10-CM | POA: Diagnosis not present

## 2020-10-17 DIAGNOSIS — D4102 Neoplasm of uncertain behavior of left kidney: Secondary | ICD-10-CM | POA: Diagnosis not present

## 2020-11-13 DIAGNOSIS — J329 Chronic sinusitis, unspecified: Secondary | ICD-10-CM | POA: Diagnosis not present

## 2020-11-13 DIAGNOSIS — R059 Cough, unspecified: Secondary | ICD-10-CM | POA: Diagnosis not present

## 2020-12-11 DIAGNOSIS — R69 Illness, unspecified: Secondary | ICD-10-CM | POA: Diagnosis not present

## 2020-12-11 DIAGNOSIS — I872 Venous insufficiency (chronic) (peripheral): Secondary | ICD-10-CM | POA: Diagnosis not present

## 2020-12-11 DIAGNOSIS — E782 Mixed hyperlipidemia: Secondary | ICD-10-CM | POA: Diagnosis not present

## 2020-12-11 DIAGNOSIS — Z955 Presence of coronary angioplasty implant and graft: Secondary | ICD-10-CM | POA: Diagnosis not present

## 2020-12-11 DIAGNOSIS — K219 Gastro-esophageal reflux disease without esophagitis: Secondary | ICD-10-CM | POA: Diagnosis not present

## 2020-12-11 DIAGNOSIS — R7303 Prediabetes: Secondary | ICD-10-CM | POA: Diagnosis not present

## 2020-12-11 DIAGNOSIS — M109 Gout, unspecified: Secondary | ICD-10-CM | POA: Diagnosis not present

## 2020-12-11 DIAGNOSIS — J302 Other seasonal allergic rhinitis: Secondary | ICD-10-CM | POA: Diagnosis not present

## 2020-12-11 DIAGNOSIS — I10 Essential (primary) hypertension: Secondary | ICD-10-CM | POA: Diagnosis not present

## 2020-12-11 DIAGNOSIS — N184 Chronic kidney disease, stage 4 (severe): Secondary | ICD-10-CM | POA: Diagnosis not present

## 2020-12-13 DIAGNOSIS — M7730 Calcaneal spur, unspecified foot: Secondary | ICD-10-CM | POA: Diagnosis not present

## 2020-12-13 DIAGNOSIS — M79672 Pain in left foot: Secondary | ICD-10-CM | POA: Diagnosis not present

## 2020-12-13 DIAGNOSIS — M19079 Primary osteoarthritis, unspecified ankle and foot: Secondary | ICD-10-CM | POA: Diagnosis not present

## 2020-12-13 DIAGNOSIS — M2010 Hallux valgus (acquired), unspecified foot: Secondary | ICD-10-CM | POA: Diagnosis not present

## 2020-12-13 DIAGNOSIS — M19072 Primary osteoarthritis, left ankle and foot: Secondary | ICD-10-CM | POA: Diagnosis not present

## 2020-12-13 DIAGNOSIS — M79671 Pain in right foot: Secondary | ICD-10-CM | POA: Diagnosis not present

## 2020-12-13 DIAGNOSIS — M76821 Posterior tibial tendinitis, right leg: Secondary | ICD-10-CM | POA: Diagnosis not present

## 2020-12-18 DIAGNOSIS — D631 Anemia in chronic kidney disease: Secondary | ICD-10-CM | POA: Diagnosis not present

## 2020-12-18 DIAGNOSIS — N184 Chronic kidney disease, stage 4 (severe): Secondary | ICD-10-CM | POA: Diagnosis not present

## 2020-12-31 IMAGING — MG MM DIGITAL DIAGNOSTIC UNILAT*L* W/ TOMO W/ CAD
6 series · 6 of 18 positions shown · non-contrast
Comparison: Outside screening mammogram dated March 31, 2019 and
earlier priors from [id number] and [id number]

CLINICAL DATA: 78-year-old patient recalled from recent screening
mammogram performed on an outside mobile unit with [facility] for
evaluation nodular asymmetry posterior left breast.

EXAM:
DIGITAL DIAGNOSTIC UNILATERAL LEFT MAMMOGRAM WITH CAD AND TOMO

[L XCCL synth-2D (1 of 2)]
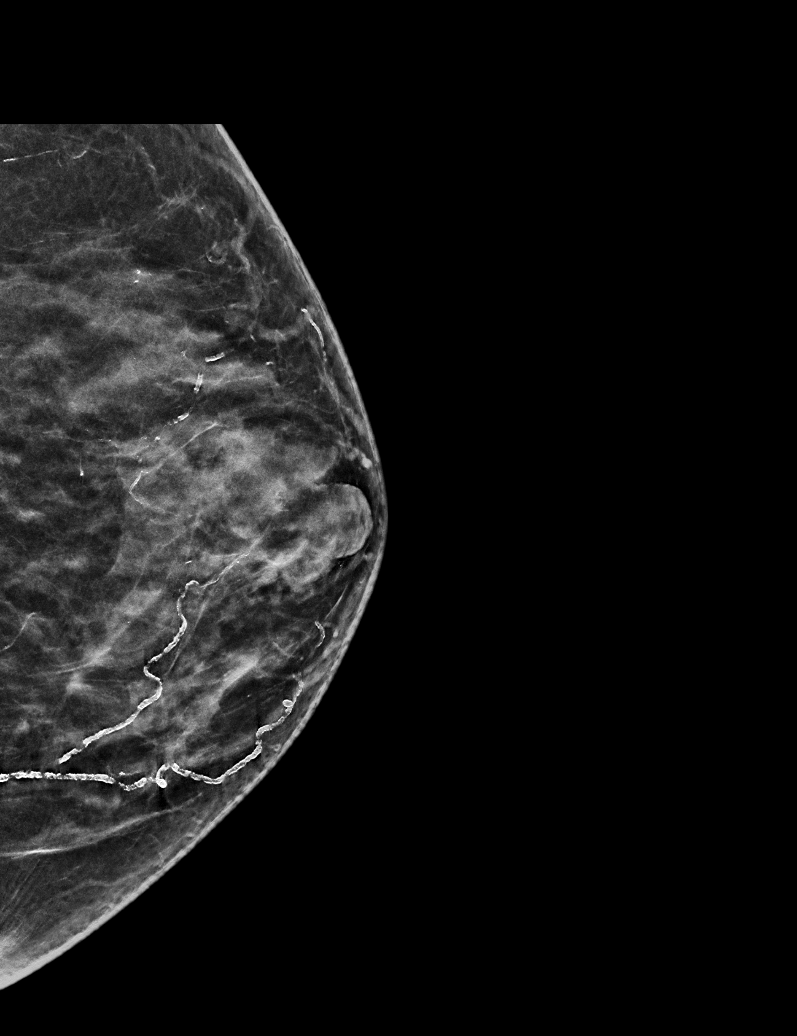

[L CC synth-2D]
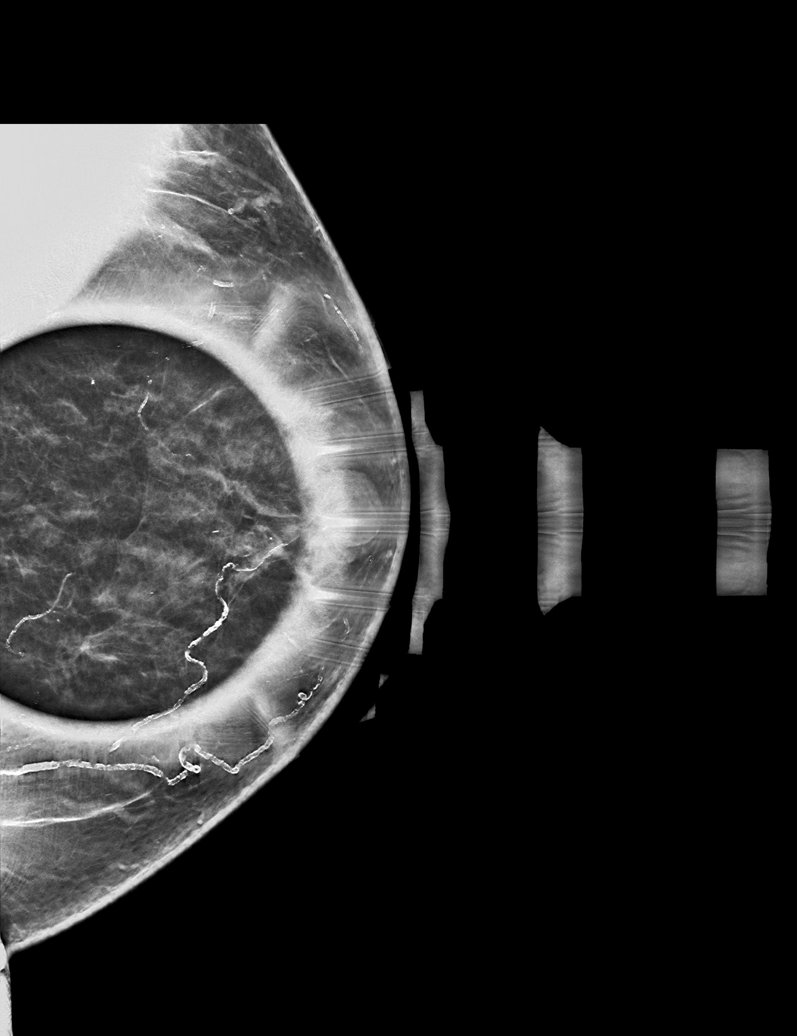

[L XCCL synth-2D (2 of 2)]
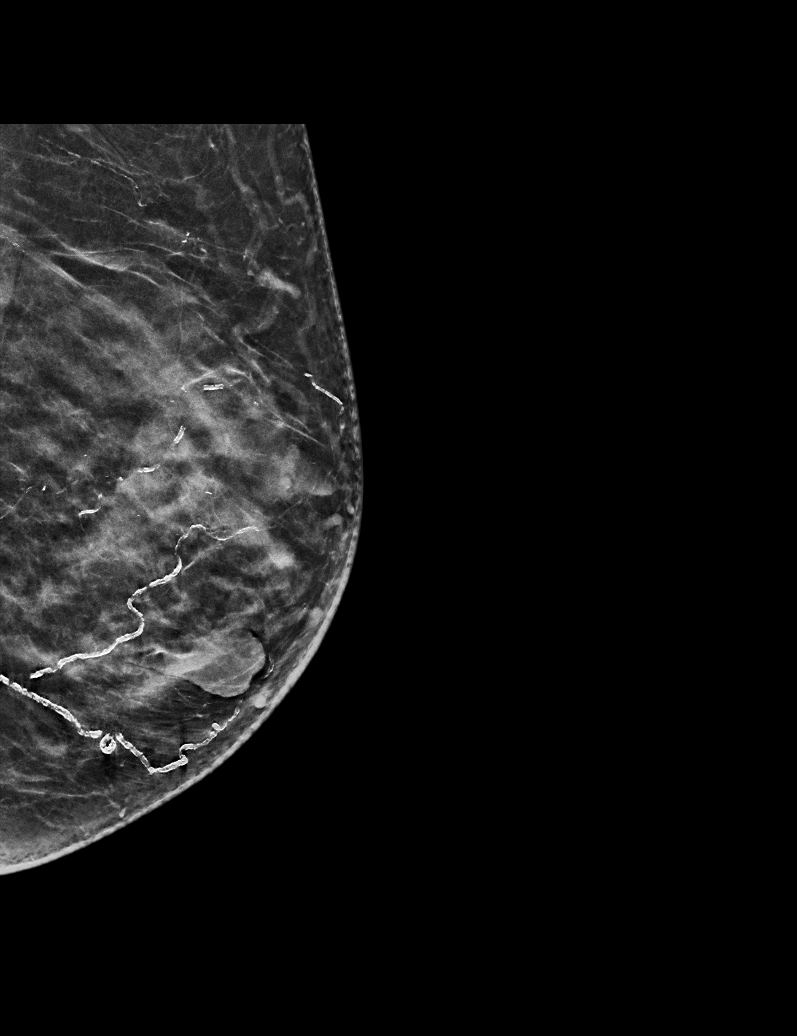

[L XCCL tomo (1 of 2) · tomo slice 26/51.0]
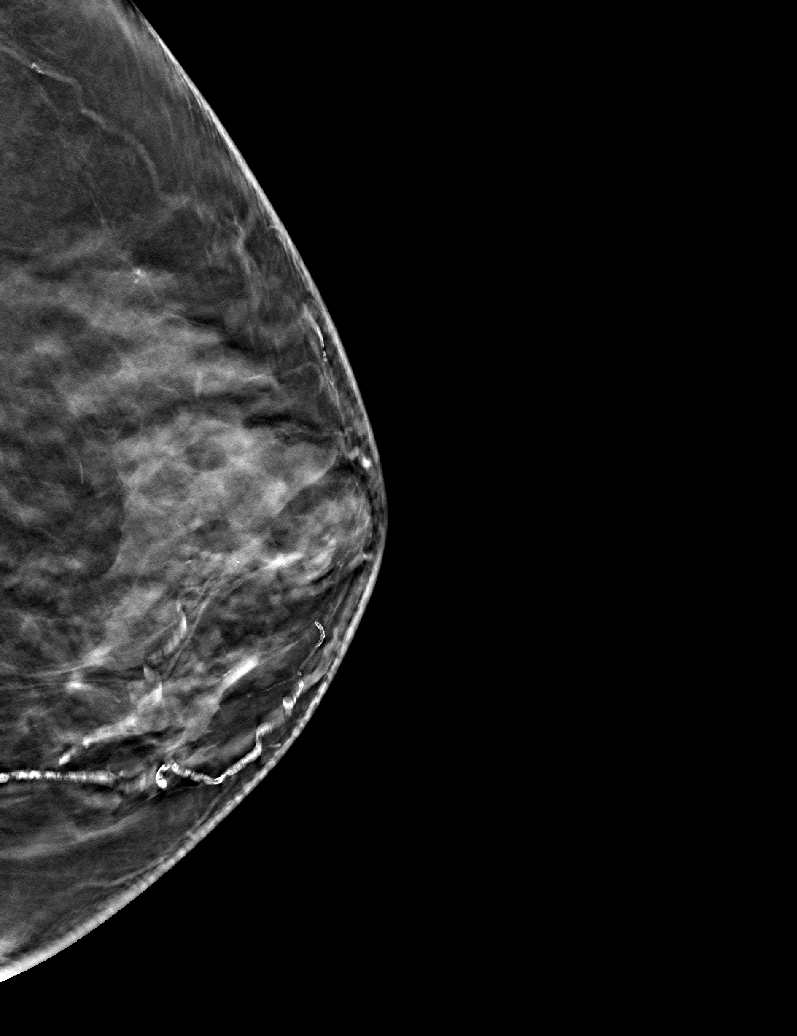

[L XCCL tomo (2 of 2) · tomo slice 27/52.0]
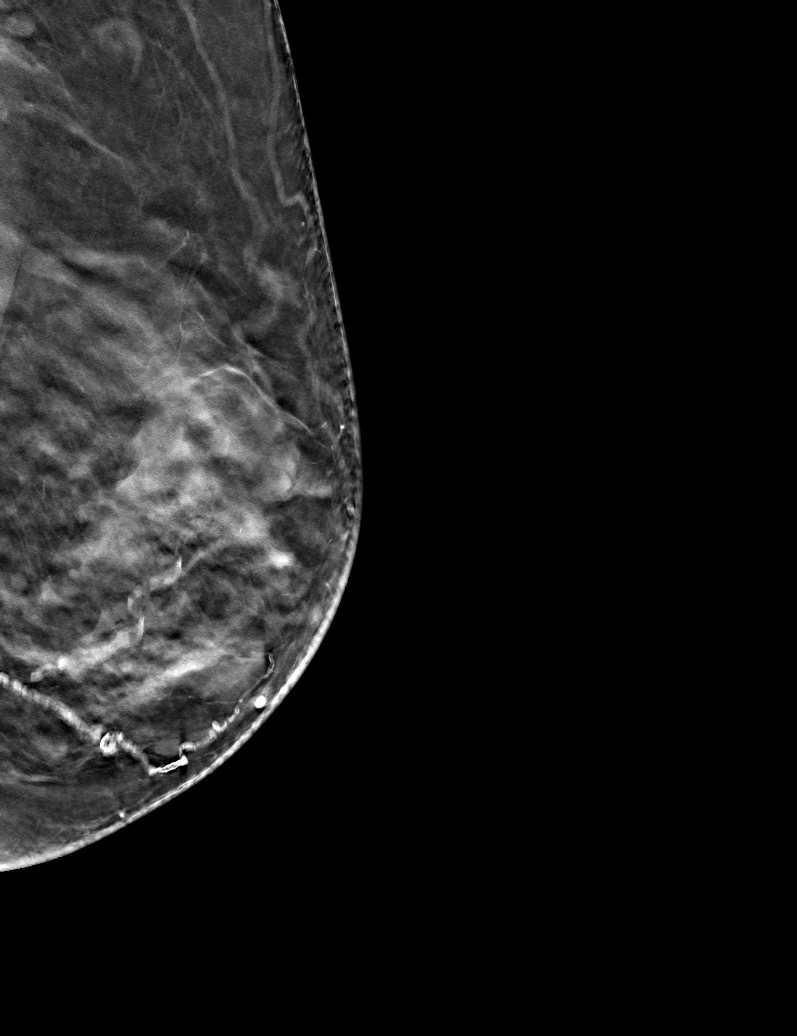

[L CC tomo · tomo slice 23/44.0]
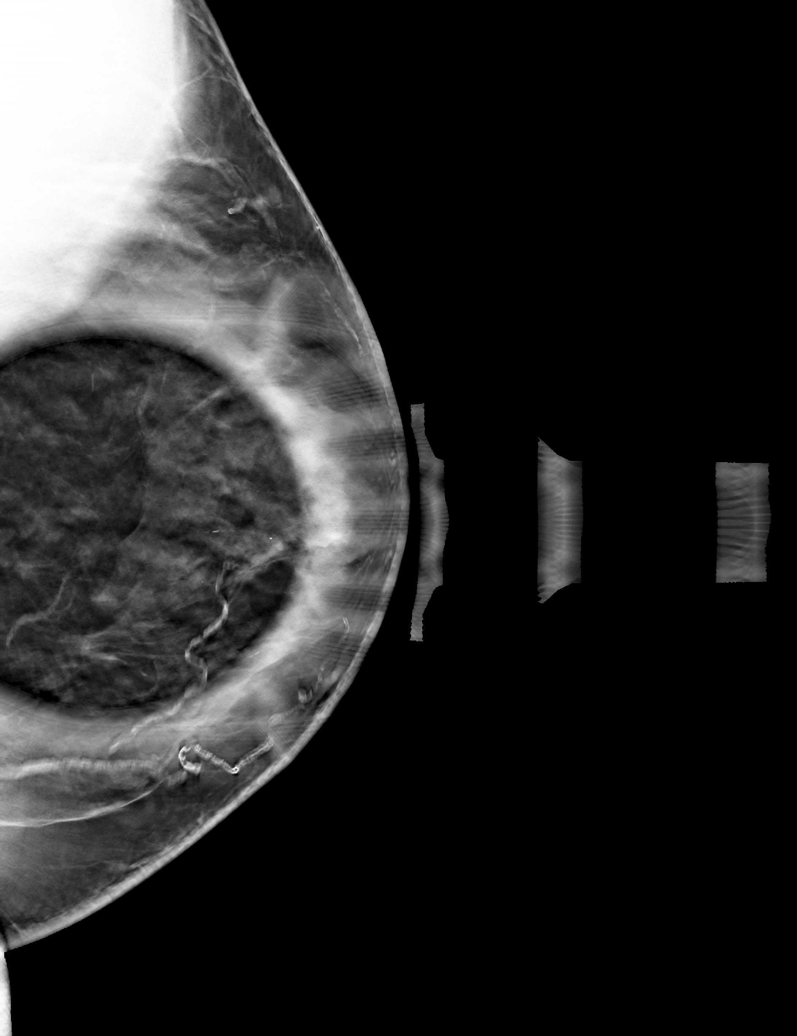

[6 of 18 positions shown; findings below may reference images not displayed]

ACR Breast Density Category c: The breast tissue is heterogeneously
dense, which may obscure small masses.
FINDINGS: Additional mammogram views with tomography, including exaggerated CC
lateral view and spot compression view in the CC projection show no
persistent nodularity along the posterior third of the
central/slightly outer left breast. Normal fibroglandular tissue is
seen. No suspicious findings.

Mammographic images were processed with CAD.
IMPRESSION: No evidence of malignancy in the left breast.

RECOMMENDATION:
Screening mammogram in one year.(Code:LC-C-DOV)

I have discussed the findings and recommendations with the patient.
If applicable, a reminder letter will be sent to the patient
regarding the next appointment.

BI-RADS CATEGORY  1: Negative.

## 2021-01-30 DIAGNOSIS — D4102 Neoplasm of uncertain behavior of left kidney: Secondary | ICD-10-CM | POA: Diagnosis not present

## 2021-02-07 DIAGNOSIS — I129 Hypertensive chronic kidney disease with stage 1 through stage 4 chronic kidney disease, or unspecified chronic kidney disease: Secondary | ICD-10-CM | POA: Diagnosis not present

## 2021-02-07 DIAGNOSIS — N2889 Other specified disorders of kidney and ureter: Secondary | ICD-10-CM | POA: Diagnosis not present

## 2021-02-07 DIAGNOSIS — R600 Localized edema: Secondary | ICD-10-CM | POA: Diagnosis not present

## 2021-02-07 DIAGNOSIS — E559 Vitamin D deficiency, unspecified: Secondary | ICD-10-CM | POA: Diagnosis not present

## 2021-02-07 DIAGNOSIS — D631 Anemia in chronic kidney disease: Secondary | ICD-10-CM | POA: Diagnosis not present

## 2021-02-07 DIAGNOSIS — R809 Proteinuria, unspecified: Secondary | ICD-10-CM | POA: Diagnosis not present

## 2021-02-07 DIAGNOSIS — M1000 Idiopathic gout, unspecified site: Secondary | ICD-10-CM | POA: Diagnosis not present

## 2021-02-07 DIAGNOSIS — N1832 Chronic kidney disease, stage 3b: Secondary | ICD-10-CM | POA: Diagnosis not present

## 2021-02-16 DIAGNOSIS — C642 Malignant neoplasm of left kidney, except renal pelvis: Secondary | ICD-10-CM | POA: Diagnosis not present

## 2021-02-16 DIAGNOSIS — M47814 Spondylosis without myelopathy or radiculopathy, thoracic region: Secondary | ICD-10-CM | POA: Diagnosis not present

## 2021-02-21 DIAGNOSIS — C642 Malignant neoplasm of left kidney, except renal pelvis: Secondary | ICD-10-CM | POA: Diagnosis not present

## 2021-02-21 DIAGNOSIS — R590 Localized enlarged lymph nodes: Secondary | ICD-10-CM | POA: Diagnosis not present

## 2021-02-21 DIAGNOSIS — K7689 Other specified diseases of liver: Secondary | ICD-10-CM | POA: Diagnosis not present

## 2021-02-21 DIAGNOSIS — N1831 Chronic kidney disease, stage 3a: Secondary | ICD-10-CM | POA: Diagnosis not present

## 2021-02-21 DIAGNOSIS — N261 Atrophy of kidney (terminal): Secondary | ICD-10-CM | POA: Diagnosis not present

## 2021-02-27 DIAGNOSIS — N261 Atrophy of kidney (terminal): Secondary | ICD-10-CM | POA: Diagnosis not present

## 2021-02-27 DIAGNOSIS — N1831 Chronic kidney disease, stage 3a: Secondary | ICD-10-CM | POA: Diagnosis not present

## 2021-02-27 DIAGNOSIS — C642 Malignant neoplasm of left kidney, except renal pelvis: Secondary | ICD-10-CM | POA: Diagnosis not present

## 2021-03-06 DIAGNOSIS — C642 Malignant neoplasm of left kidney, except renal pelvis: Secondary | ICD-10-CM | POA: Diagnosis not present

## 2021-03-06 DIAGNOSIS — N1831 Chronic kidney disease, stage 3a: Secondary | ICD-10-CM | POA: Diagnosis not present

## 2021-03-06 DIAGNOSIS — N261 Atrophy of kidney (terminal): Secondary | ICD-10-CM | POA: Diagnosis not present

## 2021-03-07 DIAGNOSIS — H524 Presbyopia: Secondary | ICD-10-CM | POA: Diagnosis not present

## 2021-03-07 DIAGNOSIS — H52209 Unspecified astigmatism, unspecified eye: Secondary | ICD-10-CM | POA: Diagnosis not present

## 2021-03-07 DIAGNOSIS — H5203 Hypermetropia, bilateral: Secondary | ICD-10-CM | POA: Diagnosis not present

## 2021-03-07 DIAGNOSIS — H269 Unspecified cataract: Secondary | ICD-10-CM | POA: Diagnosis not present

## 2021-04-18 DIAGNOSIS — N1831 Chronic kidney disease, stage 3a: Secondary | ICD-10-CM | POA: Diagnosis not present

## 2021-04-18 DIAGNOSIS — N261 Atrophy of kidney (terminal): Secondary | ICD-10-CM | POA: Diagnosis not present

## 2021-04-18 DIAGNOSIS — C642 Malignant neoplasm of left kidney, except renal pelvis: Secondary | ICD-10-CM | POA: Diagnosis not present

## 2021-05-10 DIAGNOSIS — E782 Mixed hyperlipidemia: Secondary | ICD-10-CM | POA: Diagnosis not present

## 2021-05-10 DIAGNOSIS — N184 Chronic kidney disease, stage 4 (severe): Secondary | ICD-10-CM | POA: Diagnosis not present

## 2021-05-10 DIAGNOSIS — D500 Iron deficiency anemia secondary to blood loss (chronic): Secondary | ICD-10-CM | POA: Diagnosis not present

## 2021-05-10 DIAGNOSIS — Z23 Encounter for immunization: Secondary | ICD-10-CM | POA: Diagnosis not present

## 2021-05-10 DIAGNOSIS — J301 Allergic rhinitis due to pollen: Secondary | ICD-10-CM | POA: Diagnosis not present

## 2021-05-10 DIAGNOSIS — M109 Gout, unspecified: Secondary | ICD-10-CM | POA: Diagnosis not present

## 2021-05-10 DIAGNOSIS — R69 Illness, unspecified: Secondary | ICD-10-CM | POA: Diagnosis not present

## 2021-05-10 DIAGNOSIS — R7303 Prediabetes: Secondary | ICD-10-CM | POA: Diagnosis not present

## 2021-05-10 DIAGNOSIS — I10 Essential (primary) hypertension: Secondary | ICD-10-CM | POA: Diagnosis not present

## 2021-05-10 DIAGNOSIS — Z955 Presence of coronary angioplasty implant and graft: Secondary | ICD-10-CM | POA: Diagnosis not present

## 2021-05-10 DIAGNOSIS — K219 Gastro-esophageal reflux disease without esophagitis: Secondary | ICD-10-CM | POA: Diagnosis not present

## 2021-05-14 DIAGNOSIS — I10 Essential (primary) hypertension: Secondary | ICD-10-CM | POA: Diagnosis not present

## 2021-05-14 DIAGNOSIS — G479 Sleep disorder, unspecified: Secondary | ICD-10-CM | POA: Diagnosis not present

## 2021-05-14 DIAGNOSIS — N261 Atrophy of kidney (terminal): Secondary | ICD-10-CM | POA: Diagnosis not present

## 2021-05-14 DIAGNOSIS — I2510 Atherosclerotic heart disease of native coronary artery without angina pectoris: Secondary | ICD-10-CM | POA: Diagnosis not present

## 2021-05-14 DIAGNOSIS — M5441 Lumbago with sciatica, right side: Secondary | ICD-10-CM | POA: Diagnosis not present

## 2021-05-14 DIAGNOSIS — C642 Malignant neoplasm of left kidney, except renal pelvis: Secondary | ICD-10-CM | POA: Diagnosis not present

## 2021-05-14 DIAGNOSIS — R600 Localized edema: Secondary | ICD-10-CM | POA: Diagnosis not present

## 2021-05-14 DIAGNOSIS — G4733 Obstructive sleep apnea (adult) (pediatric): Secondary | ICD-10-CM | POA: Diagnosis not present

## 2021-05-14 DIAGNOSIS — N1832 Chronic kidney disease, stage 3b: Secondary | ICD-10-CM | POA: Diagnosis not present

## 2021-05-14 DIAGNOSIS — Z79899 Other long term (current) drug therapy: Secondary | ICD-10-CM | POA: Diagnosis not present

## 2021-05-14 DIAGNOSIS — G8929 Other chronic pain: Secondary | ICD-10-CM | POA: Diagnosis not present

## 2021-05-14 DIAGNOSIS — R2689 Other abnormalities of gait and mobility: Secondary | ICD-10-CM | POA: Diagnosis not present

## 2021-05-14 DIAGNOSIS — E782 Mixed hyperlipidemia: Secondary | ICD-10-CM | POA: Diagnosis not present

## 2021-05-14 DIAGNOSIS — Z9181 History of falling: Secondary | ICD-10-CM | POA: Diagnosis not present

## 2021-05-14 DIAGNOSIS — Z01818 Encounter for other preprocedural examination: Secondary | ICD-10-CM | POA: Diagnosis not present

## 2021-07-04 DEATH — deceased
